# Patient Record
Sex: Male | Born: 1945 | Race: White | Hispanic: No | Smoking: Former smoker
Health system: Southern US, Community
[De-identification: ages and names within clinical notes are randomized; demographics above are authoritative.]

## PROBLEM LIST (undated history)

## (undated) DIAGNOSIS — G4733 Obstructive sleep apnea (adult) (pediatric): Secondary | ICD-10-CM

## (undated) DIAGNOSIS — N200 Calculus of kidney: Secondary | ICD-10-CM

## (undated) DIAGNOSIS — M1990 Unspecified osteoarthritis, unspecified site: Secondary | ICD-10-CM

## (undated) DIAGNOSIS — I10 Essential (primary) hypertension: Secondary | ICD-10-CM

## (undated) DIAGNOSIS — N400 Enlarged prostate without lower urinary tract symptoms: Secondary | ICD-10-CM

## (undated) DIAGNOSIS — E7800 Pure hypercholesterolemia, unspecified: Secondary | ICD-10-CM

## (undated) DIAGNOSIS — K219 Gastro-esophageal reflux disease without esophagitis: Secondary | ICD-10-CM

## (undated) DIAGNOSIS — R972 Elevated prostate specific antigen [PSA]: Secondary | ICD-10-CM

## (undated) HISTORY — DX: Unspecified osteoarthritis, unspecified site: M19.90

## (undated) HISTORY — DX: Gastro-esophageal reflux disease without esophagitis: K21.9

## (undated) HISTORY — DX: Pure hypercholesterolemia, unspecified: E78.00

## (undated) HISTORY — DX: Calculus of kidney: N20.0

## (undated) HISTORY — DX: Benign prostatic hyperplasia without lower urinary tract symptoms: N40.0

## (undated) HISTORY — PX: COLONOSCOPY: SHX174

## (undated) HISTORY — PX: PARS PLANA VITRECTOMY: SHX2166

## (undated) HISTORY — PX: EYE SURGERY: SHX253

## (undated) HISTORY — PX: BACK SURGERY: SHX140

## (undated) HISTORY — PX: OTHER SURGICAL HISTORY: SHX169

## (undated) HISTORY — PX: SUPRAPUBIC PROSTATECTOMY: SUR1056

## (undated) HISTORY — DX: Elevated prostate specific antigen (PSA): R97.20

---

## 2018-08-26 ENCOUNTER — Other Ambulatory Visit (INDEPENDENT_AMBULATORY_CARE_PROVIDER_SITE_OTHER): Payer: Medicare Other

## 2018-08-26 ENCOUNTER — Ambulatory Visit (INDEPENDENT_AMBULATORY_CARE_PROVIDER_SITE_OTHER): Payer: Medicare Other | Admitting: Gastroenterology

## 2018-08-26 ENCOUNTER — Encounter: Payer: Self-pay | Admitting: Gastroenterology

## 2018-08-26 VITALS — BP 122/84 | HR 72 | Ht 70.0 in | Wt 239.0 lb

## 2018-08-26 DIAGNOSIS — R1033 Periumbilical pain: Secondary | ICD-10-CM

## 2018-08-26 DIAGNOSIS — R110 Nausea: Secondary | ICD-10-CM | POA: Diagnosis not present

## 2018-08-26 DIAGNOSIS — Z86010 Personal history of colonic polyps: Secondary | ICD-10-CM

## 2018-08-26 DIAGNOSIS — R1011 Right upper quadrant pain: Secondary | ICD-10-CM

## 2018-08-26 LAB — HEPATIC FUNCTION PANEL
ALT: 23 U/L (ref 0–53)
AST: 19 U/L (ref 0–37)
Albumin: 4.5 g/dL (ref 3.5–5.2)
Alkaline Phosphatase: 70 U/L (ref 39–117)
Bilirubin, Direct: 0.1 mg/dL (ref 0.0–0.3)
Total Bilirubin: 0.7 mg/dL (ref 0.2–1.2)
Total Protein: 7.1 g/dL (ref 6.0–8.3)

## 2018-08-26 LAB — BASIC METABOLIC PANEL
BUN: 19 mg/dL (ref 6–23)
CO2: 27 mEq/L (ref 19–32)
Calcium: 10 mg/dL (ref 8.4–10.5)
Chloride: 103 mEq/L (ref 96–112)
Creatinine, Ser: 1.1 mg/dL (ref 0.40–1.50)
GFR: 65.92 mL/min (ref >60.00)
Glucose, Bld: 107 mg/dL — ABNORMAL HIGH (ref 70–99)
Potassium: 4.1 mEq/L (ref 3.5–5.1)
Sodium: 138 mEq/L (ref 135–145)

## 2018-08-26 LAB — CBC
HCT: 41.5 % (ref 39.0–52.0)
Hemoglobin: 14.5 g/dL (ref 13.0–17.0)
MCHC: 34.9 g/dL (ref 30.0–36.0)
MCV: 90.8 fl (ref 78.0–100.0)
Platelets: 252 10*3/uL (ref 150.0–400.0)
RBC: 4.57 Mil/uL (ref 4.22–5.81)
RDW: 13.4 % (ref 11.5–15.5)
WBC: 7.6 10*3/uL (ref 4.0–10.5)

## 2018-08-26 LAB — LIPASE: Lipase: 41 U/L (ref 11.0–59.0)

## 2018-08-26 LAB — AMYLASE: Amylase: 49 U/L (ref 27–131)

## 2018-08-26 MED ORDER — OMEPRAZOLE 40 MG PO CPDR: 40.0000 mg | DELAYED_RELEASE_CAPSULE | Freq: Two times a day (BID) | ORAL | 2 refills | Status: DC

## 2018-08-26 NOTE — Patient Instructions (Signed)
You have been scheduled for an abdominal ultrasound at [facility] (1st floor of hospital) on 09/03/18 at 10am. Please arrive 15 minutes prior to your appointment for registration. Make certain not to have anything to eat or drink 6 hours prior to your appointment. Should you need to reschedule your appointment, please contact radiology at [phone]. This test typically takes about 30 minutes to perform.  Your provider has requested that you go to the basement level for lab work before leaving today. Press "B" on the elevator. The lab is located at the first door on the left as you exit the elevator.  We have sent the following medications to your pharmacy for you to pick up at your convenience:  We will contact you to schedule your endoscopy procedure for the end of February  Thank you for entrusting me with your care and choosing [facility].  Dr Rush Landmark

## 2018-08-26 NOTE — Progress Notes (Signed)
GASTROENTEROLOGY OUTPATIENT CLINIC VISIT   Primary Care Provider Patient, No Pcp Per No address on file None  Patient Profile: Kyle Nixon is a 73 y.o. male with a pmh significant for BPH, FHx of Cardiac Disease, HLD, HTG.  The patient presents to the [facility] for an evaluation and management of problem(s) noted below:  Problem List 1. History of colonic polyps   2. Umbilical pain   3. Nausea     History of Present Illness: This is the patient's first to the outpatient of our GI clinic.  He is self-referred for further evaluation of issues above.  Approximately 2 weeks ago when he was at a restaurant eating steak he began to develop issues with pain in the periumbilical region that moved to the epigastrium and then to the right lower quadrant and then the right flank.  Within 15 minutes he was at the emergency department and was parking his car when the pain subsided.  Decided to leave and not be evaluated.  He did well thereafter.  However 3 days later he was once again eating a steak and potato based meal and went to bed.  A few hours later he had recurrent abdominal pain occur in the midepigastrium.  Now it is been present for the last 1.5 weeks and has not abated to complete cessation.  It is always there at some small amount of discomfort.  He thinks that eating exacerbates pain at times but not all the time.  There is no alleviating factor other than time.  He normally has 1-2 bowel movements daily.  He does not believe that having a bowel movement improves his pain.  The patient has persistent nausea but has not been vomiting.  He denies overt pyrosis.  There is no dysphagia or odynophagia.  He denies changes in his bowel habits and there is no melena or hematochezia.  His weight has been stable.  He is never had pain like this previously.  Interestingly he thinks that his food is not digesting as well since he seen an increase in how quickly he uses the  restroom after he eats.  The patient takes daily aspirin but no other nonsteroidals.  He drinks social alcohol consumption.  He has not trialed any medications over-the-counter or supplements or teas.  He is never had an upper endoscopy.  He has had a previous colonoscopy performed and has a history of colon polyps.  His last colonoscopy was over 5 years ago.  GI Review of Systems Positive as above Negative for nausea, vomiting, jaundice  Review of Systems General: Denies fevers/chills HEENT: Denies oral lesions Cardiovascular: Denies chest pain Pulmonary: Denies shortness of breath Gastroenterological: See HPI Genitourinary: Denies darkened urine Hematological: Denies easy bruising/bleeding Endocrine: Denies temperature intolerance Dermatological: Denies jaundice Psychological: Mood is stable   Medications Current Outpatient Medications  Medication Sig Dispense Refill  . Ascorbic Acid (VITAMIN C) 100 MG tablet Take 100 mg by mouth daily.    Marland Kitchen aspirin EC 81 MG tablet Take 81 mg by mouth daily.    Marland Kitchen atorvastatin (LIPITOR) 40 MG tablet Take 40 mg by mouth daily.    . CHOLECALCIFEROL PO Take 1,000 Units by mouth daily.    Marland Kitchen gemfibrozil (LOPID) 600 MG tablet Take 600 mg by mouth 2 (two) times daily before a meal.    . omeprazole (PRILOSEC) 20 MG capsule Take 20 mg by mouth daily.    . tamsulosin (FLOMAX) 0.4 MG CAPS capsule Take 0.4 mg by  mouth daily.    Marland Kitchen omeprazole (PRILOSEC) 40 MG capsule Take 1 capsule (40 mg total) by mouth 2 (two) times daily. 60 capsule 2   No current facility-administered medications for this visit.     Allergies Allergies no known allergies  Histories Past Medical History:  Diagnosis Date  . Elevated cholesterol   . Elevated PSA    Past Surgical History:  Procedure Laterality Date  . BACK SURGERY     x3 at [place] History  . Marital status: Widowed    Spouse name: Not on file  . Number of children: 1  . Years of  education: Not on file  . Highest education level: Not on file  Occupational History  . Not on file  Social Needs  . Financial resource strain: Not on file  . Food insecurity:    Worry: Not on file    Inability: Not on file  . Transportation needs:    Medical: Not on file    Non-medical: Not on file  Tobacco Use  . Smoking status: Former Research scientist (life sciences)  . Smokeless tobacco: Never Used  Substance and Sexual Activity  . Alcohol use: Yes    Alcohol/week: 1.0 standard drinks    Types: 1 Cans of beer per week    Comment: every evening  . Drug use: Never  . Sexual activity: Not Currently    Partners: Female  Lifestyle  . Physical activity:    Days per week: Not on file    Minutes per session: Not on file  . Stress: Not on file  Relationships  . Social connections:    Talks on phone: Not on file    Gets together: Not on file    Attends religious service: Not on file    Active member of club or organization: Not on file    Attends meetings of clubs or organizations: Not on file    Relationship status: Not on file  . Intimate partner violence:    Fear of current or ex partner: Not on file    Emotionally abused: Not on file    Physically abused: Not on file    Forced sexual activity: Not on file  Other Topics Concern  . Not on file  Social History Narrative  . Not on file   Family History  Problem Relation Age of Onset  . Heart disease Mother   . Heart disease Father   . Colon cancer Neg Hx   . Pancreatic cancer Neg Hx   . Liver cancer Neg Hx   . Esophageal cancer Neg Hx    I have reviewed his medical, social, and family history in detail and updated the electronic medical record as necessary.    PHYSICAL EXAMINATION  BP 122/84   Pulse 72   Ht 5\' 10"  (1.778 m)   Wt 239 lb (108.4 kg)   BMI 34.29 kg/m  Wt Readings from Last 3 Encounters:  08/26/18 239 lb (108.4 kg)  GEN: NAD, appears stated age, doesn't appear chronically ill PSYCH: Cooperative, without pressured  speech EYE: Conjunctivae pink, sclerae anicteric ENT: MMM, without oral ulcers, no erythema or exudates noted NECK: Supple CV: RR without R/Gs  RESP: CTAB posteriorly, without wheezing GI: NABS, soft, minimal tenderness to palpation in the midepigastrium and periumbilical region, ND, without rebound or guarding, no HSM appreciated MSK/EXT: No lower extremity edema SKIN: No jaundice NEURO:  Alert & Oriented x 3, no focal deficits   REVIEW OF  DATA  I reviewed the following data at the time of this encounter:  GI Procedures and Studies  Previous colonoscopy greater than 5 years ago with reported colon polyps  Laboratory Studies  No labs to review  Imaging Studies  No relevant studies to review   ASSESSMENT  Kyle Nixon is a 73 y.o. male with a pmh significant for BPH, FHx of Cardiac Disease, HLD, HTG.  The patient is seen today for evaluation and management of:  1. History of colonic polyps   2. Umbilical pain   3. Nausea    The patient is hemodynamically stable.  He has had significant change in his normal routine setting of this abdominal pains he has been experiencing.  Waxing and waning component of things and timing of when he has had discomfort is not textbook for biliary colic but I think there is enough here that is concerning that we need to rule out potential gallstone disease.  Dyspepsia/indigestion or gastritis-like discomfort may be possible but the location of where his discomfort is also not clearly textbook either.  Would like to initiate him on twice daily high-dose PPI therapy.  We will consider an upper endoscopy in approximately 6 to 8 weeks he continues to have pain and discomfort.  He has a history of prior colon polyps and will be due for surveillance and thus if a decision of a diagnostic endoscopy is made and then he can have a surveillance colonoscopy performed as well.  The risks and benefits of endoscopic evaluation were discussed with the patient; these  include but are not limited to the risk of perforation, infection, bleeding, missed lesions, lack of diagnosis, severe illness requiring hospitalization, as well as anesthesia and sedation related illnesses.  The patient is agreeable to proceed.  All patient questions were answered, to the best of my ability, and the patient agrees to the aforementioned plan of action with follow-up as indicated.   PLAN  Laboratories as outlined below Abdominal ultrasound as next evaluation for internal organ derangement Begin omeprazole 40 mg twice daily In approximately 6 to 8 weeks if discomforts are persisting we will plan an upper and surveillance lower endoscopy Patient to return for amylase/lipase/hepatic function panel in 6 to 10 hours of severe episode of pain (these orders are in place as future orders)   Orders Placed This Encounter  Procedures  . US Abdomen Complete  . CBC  . Basic metabolic panel  . Amylase  . Lipase  . Hepatic function panel  . Amylase  . Lipase  . Hepatic function panel    New Prescriptions   OMEPRAZOLE (PRILOSEC) 40 MG CAPSULE    Take 1 capsule (40 mg total) by mouth 2 (two) times daily.   Modified Medications   No medications on file    Planned Follow Up: No follow-ups on file.   Justice Britain, MD [facility] Office # [phone]

## 2018-08-28 ENCOUNTER — Encounter: Payer: Self-pay | Admitting: Gastroenterology

## 2018-08-28 DIAGNOSIS — R1011 Right upper quadrant pain: Secondary | ICD-10-CM | POA: Insufficient documentation

## 2018-08-28 DIAGNOSIS — Z860100 Personal history of colon polyps, unspecified: Secondary | ICD-10-CM | POA: Insufficient documentation

## 2018-08-28 DIAGNOSIS — R110 Nausea: Secondary | ICD-10-CM | POA: Insufficient documentation

## 2018-08-28 DIAGNOSIS — R1033 Periumbilical pain: Secondary | ICD-10-CM | POA: Insufficient documentation

## 2018-08-31 ENCOUNTER — Encounter: Payer: PRIVATE HEALTH INSURANCE | Admitting: Gastroenterology

## 2018-09-03 ENCOUNTER — Ambulatory Visit (HOSPITAL_COMMUNITY)
Admission: RE | Admit: 2018-09-03 | Discharge: 2018-09-03 | Disposition: A | Discharge status: Home / Self Care | Payer: Medicare Other | Source: Ambulatory Visit | Attending: Gastroenterology | Admitting: Gastroenterology

## 2018-09-03 DIAGNOSIS — R1033 Periumbilical pain: Secondary | ICD-10-CM | POA: Diagnosis present

## 2018-09-03 DIAGNOSIS — R110 Nausea: Secondary | ICD-10-CM | POA: Insufficient documentation

## 2018-09-03 DIAGNOSIS — Z86010 Personal history of colonic polyps: Secondary | ICD-10-CM | POA: Diagnosis not present

## 2018-09-22 ENCOUNTER — Ambulatory Visit (INDEPENDENT_AMBULATORY_CARE_PROVIDER_SITE_OTHER): Payer: Medicare Other | Admitting: Gastroenterology

## 2018-09-22 ENCOUNTER — Encounter: Payer: Self-pay | Admitting: Gastroenterology

## 2018-09-22 VITALS — BP 120/70 | HR 65 | Ht 70.0 in | Wt 243.2 lb

## 2018-09-22 DIAGNOSIS — R1033 Periumbilical pain: Secondary | ICD-10-CM | POA: Diagnosis not present

## 2018-09-22 DIAGNOSIS — R1011 Right upper quadrant pain: Secondary | ICD-10-CM

## 2018-09-22 DIAGNOSIS — Z86010 Personal history of colonic polyps: Secondary | ICD-10-CM | POA: Diagnosis not present

## 2018-09-22 NOTE — Progress Notes (Signed)
GASTROENTEROLOGY OUTPATIENT CLINIC VISIT   Primary Care Provider Patient, No Pcp Per No address on file None  Patient Profile: Kyle Nixon is a 73 y.o. male with a pmh significant for BPH, FHx of Cardiac Disease, HLD, HTG, Hx Colon Polyps (unknown pathology >5 years ago).  The patient presents to the [facility] for an evaluation and management of problem(s) noted below:  Problem List 1. History of colonic polyps   2. Umbilical pain   3. RUQ pain     History of Present Illness: Please see initial consultation note for full details of HPI.  Interval History: Patient has done exceedingly well.  He no longer has had any pain.  He is not clear if it is a result of the medications or just with time.  He feels well.  He is not sure he wants to have an upper endoscopy right now but does want to get his colonoscopy completed.  He was recently being treated for an upper respiratory/sinus infection.  GI Review of Systems Positive as above Negative for pyrosis, dysphagia, odynophagia, abdominal pain, nausea, vomiting, melena, hematochezia, change in bowel habits  Review of Systems General: Denies fevers/chills Cardiovascular: Denies chest pain Pulmonary: Denies shortness of breath Gastroenterological: See HPI Genitourinary: Denies darkened urine Hematological: Denies easy bruising Dermatological: Denies jaundice Psychological: Mood is stable   Medications Current Outpatient Medications  Medication Sig Dispense Refill  . Ascorbic Acid (VITAMIN C) 100 MG tablet Take 100 mg by mouth daily.    Marland Kitchen aspirin EC 81 MG tablet Take 81 mg by mouth daily.    Marland Kitchen atorvastatin (LIPITOR) 40 MG tablet Take 40 mg by mouth daily.    . CHOLECALCIFEROL PO Take 1,000 Units by mouth daily.    Marland Kitchen gemfibrozil (LOPID) 600 MG tablet Take 600 mg by mouth 2 (two) times daily before a meal.    . predniSONE (DELTASONE) 10 MG tablet Take 10 mg by mouth taper from 4 doses each day to 1 dose  and stop.    . tamsulosin (FLOMAX) 0.4 MG CAPS capsule Take 0.4 mg by mouth daily.     No current facility-administered medications for this visit.     Allergies No Known Allergies  Histories Past Medical History:  Diagnosis Date  . Elevated cholesterol   . Elevated PSA    Past Surgical History:  Procedure Laterality Date  . BACK SURGERY     x3 at [place] History  . Marital status: Widowed    Spouse name: Not on file  . Number of children: 1  . Years of education: Not on file  . Highest education level: Not on file  Occupational History  . Not on file  Social Needs  . Financial resource strain: Not on file  . Food insecurity:    Worry: Not on file    Inability: Not on file  . Transportation needs:    Medical: Not on file    Non-medical: Not on file  Tobacco Use  . Smoking status: Former Research scientist (life sciences)  . Smokeless tobacco: Never Used  Substance and Sexual Activity  . Alcohol use: Yes    Alcohol/week: 1.0 standard drinks    Types: 1 Cans of beer per week    Comment: every evening  . Drug use: Never  . Sexual activity: Not Currently    Partners: Female  Lifestyle  . Physical activity:    Days per week: Not on file    Minutes per  session: Not on file  . Stress: Not on file  Relationships  . Social connections:    Talks on phone: Not on file    Gets together: Not on file    Attends religious service: Not on file    Active member of club or organization: Not on file    Attends meetings of clubs or organizations: Not on file    Relationship status: Not on file  . Intimate partner violence:    Fear of current or ex partner: Not on file    Emotionally abused: Not on file    Physically abused: Not on file    Forced sexual activity: Not on file  Other Topics Concern  . Not on file  Social History Narrative  . Not on file   Family History  Problem Relation Age of Onset  . Heart disease Mother   . Heart disease Father   . Colon  cancer Neg Hx   . Pancreatic cancer Neg Hx   . Liver cancer Neg Hx   . Esophageal cancer Neg Hx    I have reviewed his medical, social, and family history in detail and updated the electronic medical record as necessary.    PHYSICAL EXAMINATION  BP 120/70   Pulse 65   Ht 5\' 10"  (1.778 m)   Wt 243 lb 3.2 oz (110.3 kg)   SpO2 95%   BMI 34.90 kg/m  Wt Readings from Last 3 Encounters:  09/22/18 243 lb 3.2 oz (110.3 kg)  08/26/18 239 lb (108.4 kg)  GEN: NAD, appears stated age, doesn't appear chronically ill PSYCH: Cooperative, without pressured speech EYE: Conjunctivae pink, sclerae anicteric ENT: MMM CV: RR without R/Gs  RESP: CTAB posteriorly, without wheezing GI: NABS, soft, nontender, nondistended, rounded, no hepatosplenomegaly appreciated, without rebound or guarding MSK/EXT: No lower extremity edema SKIN: No jaundice NEURO:  Alert & Oriented x 3, no focal deficits   REVIEW OF DATA  I reviewed the following data at the time of this encounter:  GI Procedures and Studies  Previous colonoscopy greater than 5 years ago with reported colon polyps We were not able to obtain the results  Laboratory Studies  Reviewed in epic  Imaging Studies  January 2020 ultrasound abdomen IMPRESSION: No acute or focal abnormality identified.   ASSESSMENT  Mr. Kandler is a 73 y.o. male with a pmh significant for BPH, FHx of Cardiac Disease, HLD, HTG, Hx Colon Polyps (unknown pathology >5 years ago).  The patient is seen today for evaluation and management of:  1. History of colonic polyps   2. Umbilical pain   3. RUQ pain    The patient is doing exceedingly well at this point in time.  No evidence on imaging to suggest cholecystitis or biliary colic/cholelithiasis playing a role.  As his upper GI symptoms have abated we will plan to start titrating down his omeprazole.  He will complete through the month of February twice daily PPI and then start transitioning to 40 mg once daily  for 2 months and then down to 20 mg over-the-counter for 2 months and then hopefully off.  If in the period of down titrating his PPI he has recurrence of his symptoms then we will consider up titrating and an upper endoscopy for diagnostic purposes.  The patient is due for colon cancer screening/colon polyp surveillance as he has a history of prior polyps greater than 5 years ago and was told to have a 5-year follow-up (we were never able to obtain  the outside records).  We will proceed with a colonoscopy for surveillance purposes.  The risks and benefits of endoscopic evaluation were discussed with the patient; these include but are not limited to the risk of perforation, infection, bleeding, missed lesions, lack of diagnosis, severe illness requiring hospitalization, as well as anesthesia and sedation related illnesses.  The patient is agreeable to proceed.  All patient questions were answered, to the best of my ability, and the patient agrees to the aforementioned plan of action with follow-up as indicated.   PLAN  Proceed with scheduling a colonoscopy for surveillance PPI 40 mg twice daily for completion of February then start 40 mg daily March/April then 20 mg daily May/June then off If evidence of recurrent abdominal pain with down titration will then re-uptitrate and consider a diagnostic endoscopy to be added Patient to return for amylase/lipase/hepatic function panel in 6 to 10 hours of severe episode of pain (these orders remain in place as future orders)   No orders of the defined types were placed in this encounter.   New Prescriptions   No medications on file   Modified Medications   No medications on file    Planned Follow Up: No follow-ups on file.   Justice Britain, MD [facility] Office # [phone]

## 2018-09-22 NOTE — Patient Instructions (Signed)
If you are age 72 or older, your body mass index should be between 23-30. Your Body mass index is 34.9 kg/m. If this is out of the aforementioned range listed, please consider follow up with your Primary Care Provider.  If you are age 52 or younger, your body mass index should be between 19-25. Your Body mass index is 34.9 kg/m. If this is out of the aformentioned range listed, please consider follow up with your Primary Care Provider.     Continue PPI- Omeprazole 40 mg twice daily until the end of the month.  Starting in March - April take omeprazole 40mg  once daily. You can then start over the counter omeprazole once daily thereafter.   It has been recommended to you by your physician that you have a(n) Colon/ LEC/ in April 2020 completed. Per your request, we did not schedule the procedure(s) today. Please contact our office at [phone] should you decide to have the procedure completed.  Thank you for choosing me and [facility].  Dr. Rush Landmark

## 2018-09-23 ENCOUNTER — Encounter: Payer: Self-pay | Admitting: Gastroenterology

## 2019-11-26 IMAGING — US US ABDOMEN COMPLETE
1 series · 14 of 25 positions shown · non-contrast
Comparison: No recent prior.

CLINICAL DATA: Nausea.  Shoulder pain.

EXAM:
ABDOMEN ULTRASOUND COMPLETE

[Series 1: us abdomen complete · 14 of 78 slices shown]
[im 1/78]
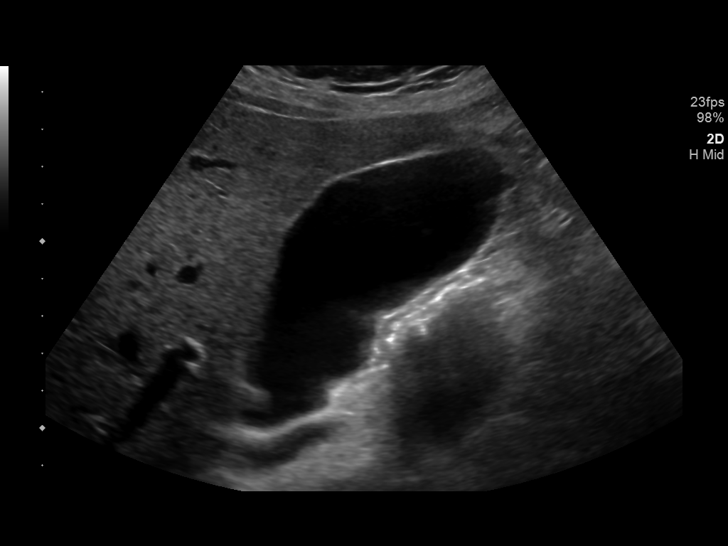
[im 7/78]
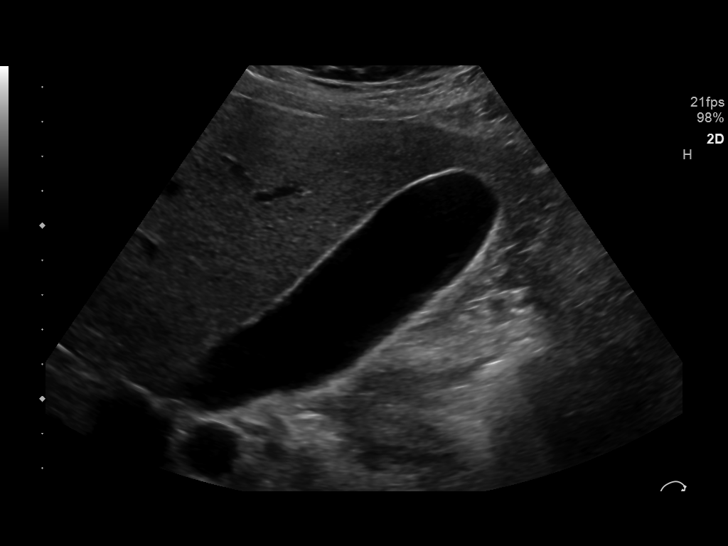
[im 13/78]
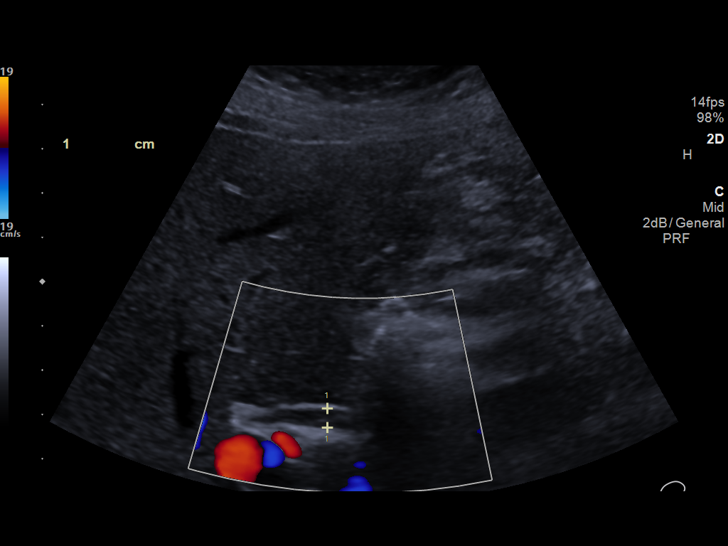
[im 20/78]
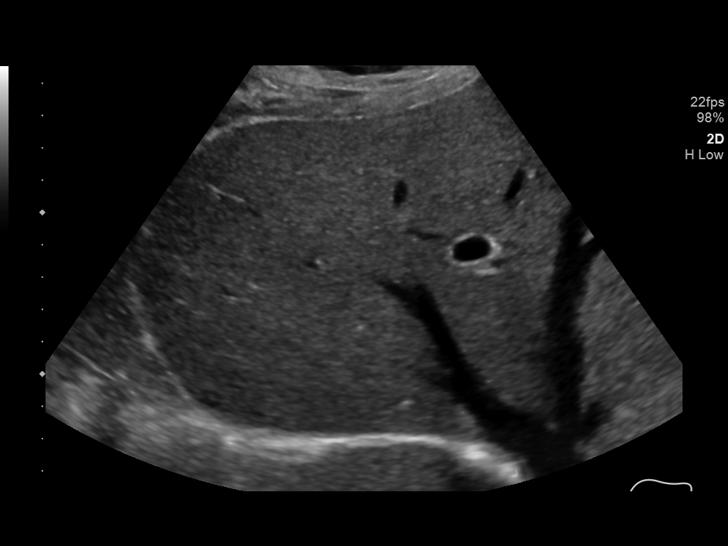
[im 26/78]
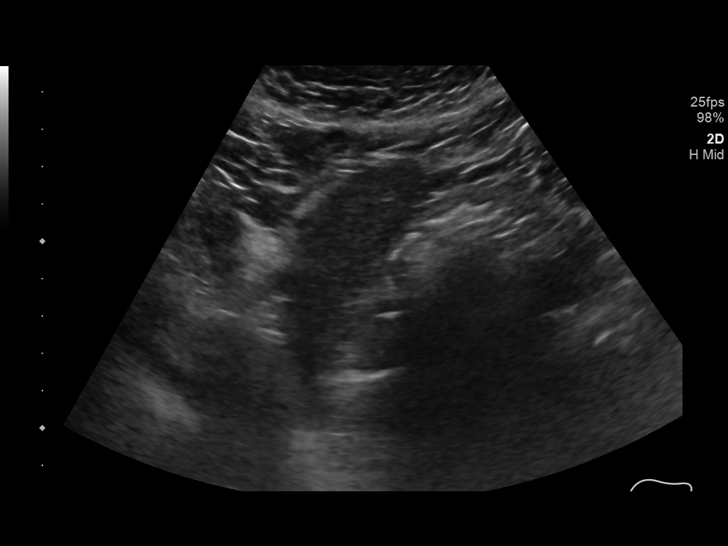
[im 29/78]
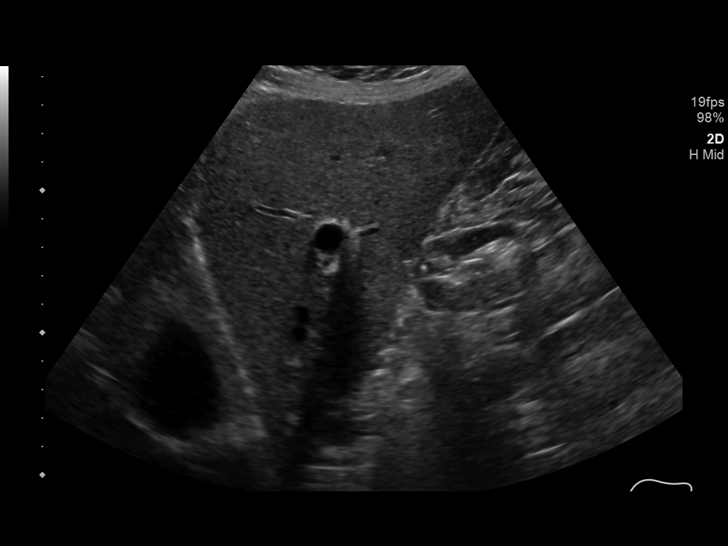
[im 36/78]
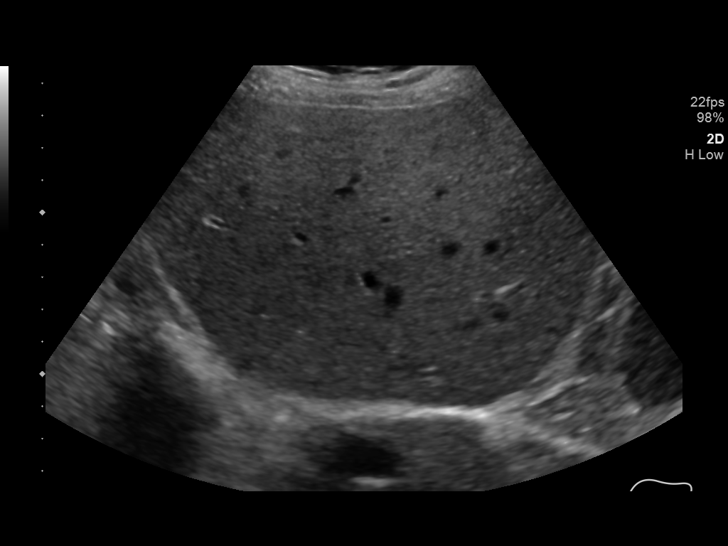
[im 42/78]
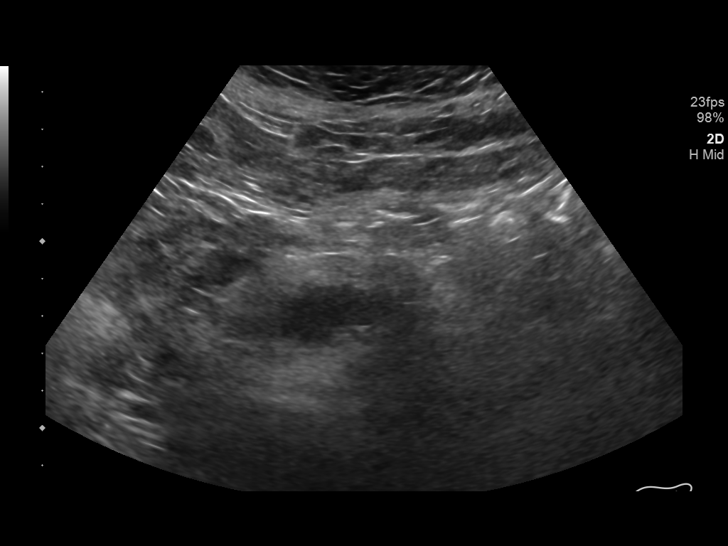
[im 49/78]
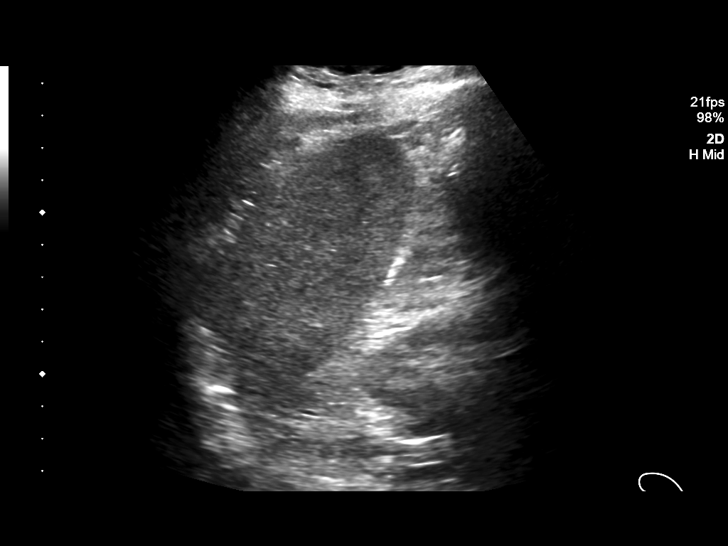
[im 52/78]
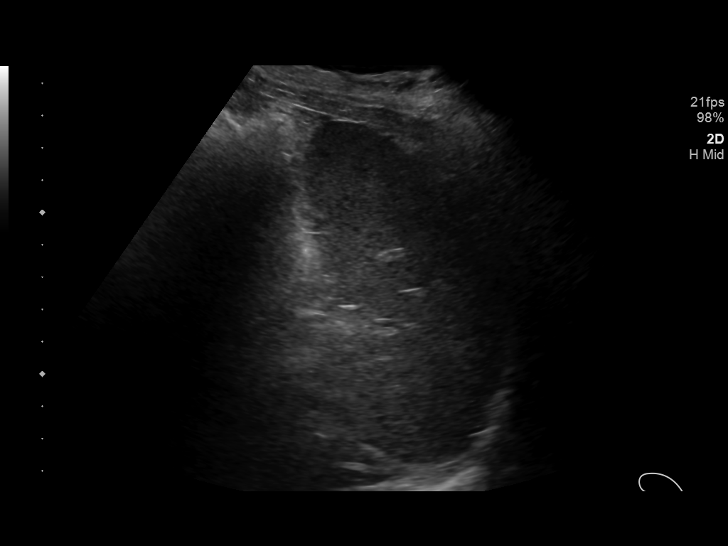
[im 58/78]
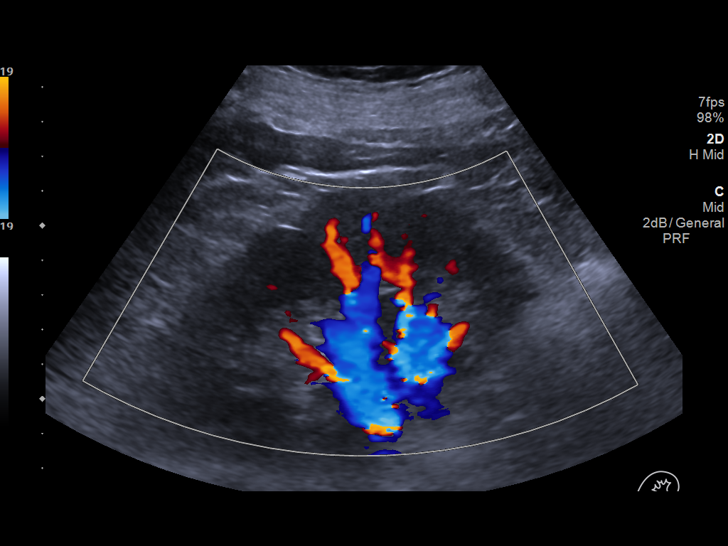
[im 65/78]
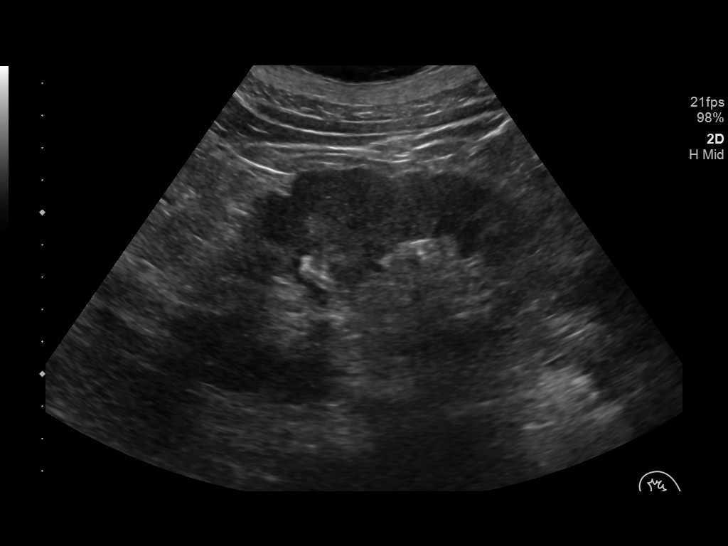
[im 71/78]
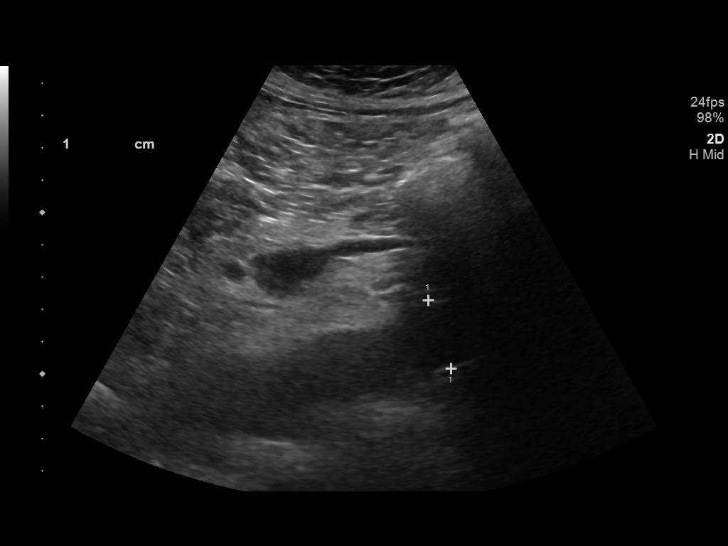
[im 78/78]
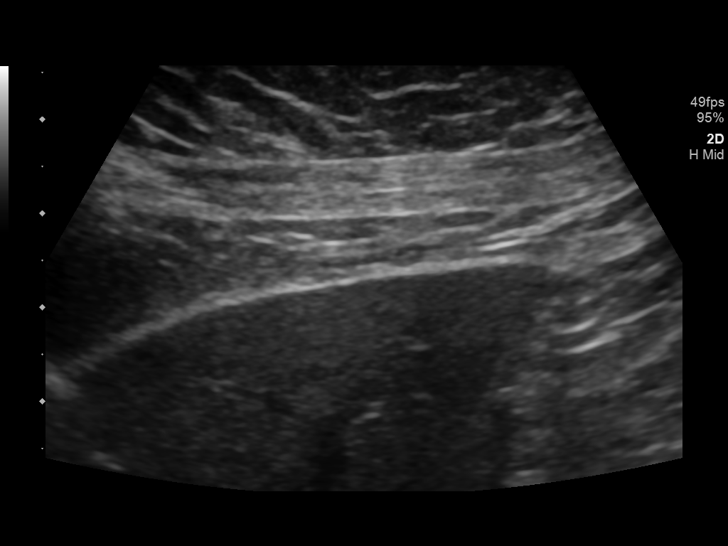

[14 of 25 positions shown; findings below may reference images not displayed]

FINDINGS: Gallbladder: No gallstones or wall thickening visualized. No
sonographic Murphy sign noted by sonographer.

Common bile duct: Diameter: 4.3 mm

Liver: No focal lesion identified. Within normal limits in
parenchymal echogenicity. Portal vein is patent on color Doppler
imaging with normal direction of blood flow towards the liver.

IVC: No abnormality visualized.

Pancreas: Visualized portion unremarkable.

Spleen: Size and appearance within normal limits.

Right Kidney: Length: 12.9 cm. Echogenicity within normal limits. No
mass or hydronephrosis visualized.

Left Kidney: Length: 12.4 cm. Echogenicity within normal limits. No
mass or hydronephrosis visualized.

Abdominal aorta: No aneurysm visualized.

Other findings: None.
IMPRESSION: No acute or focal abnormality identified.

## 2020-05-17 ENCOUNTER — Telehealth: Payer: Self-pay | Admitting: Gastroenterology

## 2020-05-17 NOTE — Telephone Encounter (Signed)
Pt called to schedule endo colon as he stated that he had to postponed it because of Covid. Per Dr Rush Landmark ov notes, pt needs colon but not sure about endo. Pt states that he is still having issues with his stomach. Please advise if he needs to come for ov again or if he can be direct booked for both procedures.

## 2020-05-18 NOTE — Telephone Encounter (Signed)
OK to proceed with both EGD/Colonoscopy.  My last notation was to consider EGD if still or recurrent issues, so I am OK for double. Thanks. GM

## 2020-05-18 NOTE — Telephone Encounter (Signed)
Left message on vm for patient to return call to get scheduled for EGD/colon. Thank You

## 2020-05-22 ENCOUNTER — Encounter: Payer: Self-pay | Admitting: Gastroenterology

## 2020-06-25 NOTE — Telephone Encounter (Signed)
Called patient to offer EGD at time of Colonoscopy.  Patient changed his mind and does not want to have EGD at this time.

## 2020-06-25 NOTE — Telephone Encounter (Signed)
Noted  

## 2020-07-03 ENCOUNTER — Ambulatory Visit (AMBULATORY_SURGERY_CENTER): Payer: Self-pay | Admitting: *Deleted

## 2020-07-03 ENCOUNTER — Other Ambulatory Visit: Payer: Self-pay

## 2020-07-03 VITALS — Ht 68.5 in | Wt 247.2 lb

## 2020-07-03 DIAGNOSIS — Z86010 Personal history of colonic polyps: Secondary | ICD-10-CM

## 2020-07-03 NOTE — Progress Notes (Signed)
Patient denies any allergies to egg or soy products. Patient denies complications with anesthesia/sedation.  Patient denies oxygen use at home and denies diet medications. Emmi instructions for colonoscopy explainedbut patient denied information.  Patient is fully vaccinated by [facility].  Booster on 06/24/20.

## 2020-07-18 ENCOUNTER — Other Ambulatory Visit: Payer: Self-pay

## 2020-07-18 ENCOUNTER — Ambulatory Visit (AMBULATORY_SURGERY_CENTER): Payer: Medicare Other | Admitting: Gastroenterology

## 2020-07-18 ENCOUNTER — Encounter: Payer: Self-pay | Admitting: Gastroenterology

## 2020-07-18 VITALS — BP 132/90 | HR 52 | Temp 98.1°F | Resp 15 | Ht 68.5 in | Wt 247.2 lb

## 2020-07-18 DIAGNOSIS — Z86010 Personal history of colonic polyps: Secondary | ICD-10-CM | POA: Diagnosis present

## 2020-07-18 DIAGNOSIS — D120 Benign neoplasm of cecum: Secondary | ICD-10-CM

## 2020-07-18 DIAGNOSIS — D127 Benign neoplasm of rectosigmoid junction: Secondary | ICD-10-CM

## 2020-07-18 DIAGNOSIS — D123 Benign neoplasm of transverse colon: Secondary | ICD-10-CM

## 2020-07-18 DIAGNOSIS — D122 Benign neoplasm of ascending colon: Secondary | ICD-10-CM

## 2020-07-18 MED ORDER — FLEET ENEMA 7-19 GM/118ML RE ENEM
1.0000 | ENEMA | Freq: Once | RECTAL | Status: AC
  Administered 2020-07-18: 1 via RECTAL

## 2020-07-18 MED ORDER — SODIUM CHLORIDE 0.9 % IV SOLN: 500.0000 mL | Freq: Once | INTRAVENOUS | Status: DC

## 2020-07-18 NOTE — Progress Notes (Signed)
A/ox3, pleased with MAC, report to RN 

## 2020-07-18 NOTE — Progress Notes (Signed)
Called to room to assist during endoscopic procedure.  Patient ID and intended procedure confirmed with present staff. Received instructions for my participation in the procedure from the performing physician.  

## 2020-07-18 NOTE — Op Note (Addendum)
[place] Patient Name: Kyle Nixon Procedure Date: 07/18/2020 3:19 PM MRN: [id number] Endoscopist: Justice Britain , MD Age: 74 Referring MD:  Date of Birth: 01-14-1946 Gender: Male Account #: [id number] Procedure:                Colonoscopy Indications:              Screening for colorectal malignant neoplasm Medicines:                Monitored Anesthesia Care Procedure:                Pre-Anesthesia Assessment:                           - Prior to the procedure, a History and Physical                            was performed, and patient medications and                            allergies were reviewed. The patient's tolerance of                            previous anesthesia was also reviewed. The risks                            and benefits of the procedure and the sedation                            options and risks were discussed with the patient.                            All questions were answered, and informed consent                            was obtained. Prior Anticoagulants: The patient has                            taken no previous anticoagulant or antiplatelet                            agents. ASA Grade Assessment: II - A patient with                            mild systemic disease. After reviewing the risks                            and benefits, the patient was deemed in                            satisfactory condition to undergo the procedure.                           After obtaining informed consent, the colonoscope  was passed under direct vision. Throughout the                            procedure, the patient's blood pressure, pulse, and                            oxygen saturations were monitored continuously. The                            Colonoscope was introduced through the anus and                            advanced to the 5 cm into the ileum. The                            colonoscopy was  performed without difficulty. The                            patient tolerated the procedure. The quality of the                            bowel preparation was adequate. The terminal ileum,                            ileocecal valve, appendiceal orifice, and rectum                            were photographed. Scope In: 3:37:01 PM Scope Out: 4:00:17 PM Scope Withdrawal Time: 0 hours 20 minutes 9 seconds  Total Procedure Duration: 0 hours 23 minutes 16 seconds  Findings:                 The digital rectal exam findings include                            hemorrhoids. Pertinent negatives include no                            palpable rectal lesions.                           The terminal ileum and ileocecal valve appeared                            normal.                           12 sessile and semi-sessile polyps were found in                            the recto-sigmoid colon (2), transverse colon (6),                            ascending colon (2), and cecum (2). The polyps were  2 to 14 mm in size. These polyps were removed with                            a cold snare. Resection and retrieval were complete.                           Normal mucosa was found in the entire colon                            otherwise.                           Non-bleeding non-thrombosed external and internal                            hemorrhoids were found during retroflexion, during                            perianal exam and during digital exam. The                            hemorrhoids were Grade II (internal hemorrhoids                            that prolapse but reduce spontaneously). Complications:            No immediate complications. Estimated Blood Loss:     Estimated blood loss was minimal. Impression:               - Hemorrhoids found on digital rectal exam.                           - The examined portion of the ileum was normal.                           -  12, 2 to 14 mm polyps at the recto-sigmoid colon,                            in the transverse colon, in the ascending colon and                            in the cecum, removed with a cold snare. Resected                            and retrieved.                           - Normal mucosa in the entire examined colon                            otherwise.                           - Non-bleeding non-thrombosed external and internal  hemorrhoids. Recommendation:           - The patient will be observed post-procedure,                            until all discharge criteria are met.                           - Discharge patient to home.                           - Patient has a contact number available for                            emergencies. The signs and symptoms of potential                            delayed complications were discussed with the                            patient. Return to normal activities tomorrow.                            Written discharge instructions were provided to the                            patient.                           - High fiber diet.                           - Use FiberCon 1-2 tablets PO daily.                           - Continue present medications.                           - Await pathology results.                           - Repeat colonoscopy in likely 1 year for                            surveillance based on pathology results.                           - The findings and recommendations were discussed                            with the patient.                           - The findings and recommendations were discussed                            with the patient's family. Justice Britain, MD 07/18/2020 4:07:40  PM

## 2020-07-18 NOTE — Progress Notes (Signed)
VS-CW  Pt's states no medical or surgical changes since previsit or office visit.   Pt reports last BM was dark brown liquid. Dr. Rush Landmark made aware. Orders received for fleets enema. Results were clear, dark yellow. No solid pieces. MD made aware.

## 2020-07-18 NOTE — Patient Instructions (Addendum)
Handouts were given to you on polyps, hemorrhoids, and a high fiber diet with liberal fluid intake. Add ober the counter fiber supplement FIBERCON take 1-2 tabs daily by mouth.  Make sure you have a liberal amount of fluids. You may resume your current medications today. Await biopsy results. It usually takes 1-3 weeks to receive the pathology results. Most likely, repeat colonoscopy in 1 year for repeat colonoscopy. Please call if any questions or concerns.    YOU HAD AN ENDOSCOPIC PROCEDURE TODAY AT [facility]:   Refer to the procedure report that was given to you for any specific questions about what was found during the examination.  If the procedure report does not answer your questions, please call your gastroenterologist to clarify.  If you requested that your care partner not be given the details of your procedure findings, then the procedure report has been included in a sealed envelope for you to review at your convenience later.  YOU SHOULD EXPECT: Some feelings of bloating in the abdomen. Passage of more gas than usual.  Walking can help get rid of the air that was put into your GI tract during the procedure and reduce the bloating. If you had a lower endoscopy (such as a colonoscopy or flexible sigmoidoscopy) you may notice spotting of blood in your stool or on the toilet paper. If you underwent a bowel prep for your procedure, you may not have a normal bowel movement for a few days.  Please Note:  You might notice some irritation and congestion in your nose or some drainage.  This is from the oxygen used during your procedure.  There is no need for concern and it should clear up in a day or so.  SYMPTOMS TO REPORT IMMEDIATELY:   Following lower endoscopy (colonoscopy or flexible sigmoidoscopy):  Excessive amounts of blood in the stool  Significant tenderness or worsening of abdominal pains  Swelling of the abdomen that is new, acute  Fever of 100F or  higher   For urgent or emergent issues, a gastroenterologist can be reached at any hour by calling [phone]. Do not use MyChart messaging for urgent concerns.    DIET:  We do recommend a small meal at first, but then you may proceed to your regular diet.  Drink plenty of fluids but you should avoid alcoholic beverages for 24 hours.  ACTIVITY:  You should plan to take it easy for the rest of today and you should NOT DRIVE or use heavy machinery until tomorrow (because of the sedation medicines used during the test).    FOLLOW UP: Our staff will call the number listed on your records 48-72 hours following your procedure to check on you and address any questions or concerns that you may have regarding the information given to you following your procedure. If we do not reach you, we will leave a message.  We will attempt to reach you two times.  During this call, we will ask if you have developed any symptoms of COVID 19. If you develop any symptoms (ie: fever, flu-like symptoms, shortness of breath, cough etc.) before then, please call [phone].  If you test positive for Covid 19 in the 2 weeks post procedure, please call and report this information to [place].    If any biopsies were taken you will be contacted by phone or by letter within the next 1-3 weeks.  Please call us at [phone] if you have not heard about the biopsies in  3 weeks.    SIGNATURES/CONFIDENTIALITY: You and/or your care partner have signed paperwork which will be entered into your electronic medical record.  These signatures attest to the fact that that the information above on your After Visit Summary has been reviewed and is understood.  Full responsibility of the confidentiality of this discharge information lies with you and/or your care-partner.

## 2020-07-18 NOTE — Progress Notes (Signed)
No problems noted in the recovery room. maw 

## 2020-07-20 ENCOUNTER — Telehealth: Payer: Self-pay | Admitting: *Deleted

## 2020-07-20 NOTE — Telephone Encounter (Signed)
  Follow up Call-  Call back number 07/18/2020  Post procedure Call Back phone  # [phone]  Permission to leave phone message Yes     Patient questions:  Do you have a fever, pain , or abdominal swelling? No. Pain Score  0 *  Have you tolerated food without any problems? Yes.    Have you been able to return to your normal activities? Yes.    Do you have any questions about your discharge instructions: Diet   Yes.   Medications  Yes.   Follow up visit  Yes.    Do you have questions or concerns about your Care? No.  Actions: * If pain score is 4 or above: No action needed, pain <4.  1. Have you developed a fever since your procedure? no  2.   Have you had an respiratory symptoms (SOB or cough) since your procedure? no  3.   Have you tested positive for COVID 19 since your procedure no  4.   Have you had any family members/close contacts diagnosed with the COVID 19 since your procedure?  no   If yes to any of these questions please route to Joylene John, RN and Joella Prince, RN

## 2020-07-25 ENCOUNTER — Encounter: Payer: Self-pay | Admitting: Gastroenterology

## 2020-12-28 ENCOUNTER — Ambulatory Visit: Payer: Medicare Other | Admitting: Gastroenterology

## 2021-07-02 ENCOUNTER — Encounter: Payer: Self-pay | Admitting: Gastroenterology

## 2021-08-15 ENCOUNTER — Ambulatory Visit: Payer: Medicare Other | Admitting: Gastroenterology

## 2021-08-30 ENCOUNTER — Ambulatory Visit: Payer: Medicare Other | Admitting: Nurse Practitioner

## 2021-09-03 ENCOUNTER — Encounter: Payer: Medicare Other | Admitting: Gastroenterology

## 2021-09-12 ENCOUNTER — Encounter: Payer: Self-pay | Admitting: Gastroenterology

## 2021-09-13 ENCOUNTER — Ambulatory Visit (INDEPENDENT_AMBULATORY_CARE_PROVIDER_SITE_OTHER): Payer: Medicare Other | Admitting: Nurse Practitioner

## 2021-09-13 ENCOUNTER — Encounter: Payer: Self-pay | Admitting: Nurse Practitioner

## 2021-09-13 VITALS — BP 140/80 | HR 87 | Ht 68.5 in | Wt 252.8 lb

## 2021-09-13 DIAGNOSIS — K219 Gastro-esophageal reflux disease without esophagitis: Secondary | ICD-10-CM

## 2021-09-13 DIAGNOSIS — R110 Nausea: Secondary | ICD-10-CM | POA: Diagnosis not present

## 2021-09-13 DIAGNOSIS — R1010 Upper abdominal pain, unspecified: Secondary | ICD-10-CM

## 2021-09-13 DIAGNOSIS — Z860100 Personal history of colon polyps, unspecified: Secondary | ICD-10-CM

## 2021-09-13 NOTE — Progress Notes (Signed)
Attending Physician's Attestation   I have reviewed the chart.   I agree with the Advanced Practitioner's note, impression, and recommendations with any updates as below.    Jolea Dolle Mansouraty, MD [facility] Advanced [facility]oscopy Office # [phone]  

## 2021-09-13 NOTE — Patient Instructions (Addendum)
For now, increase Omeprazole to twice daily. Take 30 minutes prior to breakfast and 30 minutes prior to dinner  Please stop Ibuprofen until time of EGD   Acid Reflux --If you are taking anti-reflux medication be sure to take them 30 minutes before meal(s) --To help with acid reflux symptoms you should avoid evening meals / bedtime snacks. If able, elevate head of bed 6-8 inches. If unable to elevate the head of the bed consider purchasing a wedge pillow to sleep on at night.    --Weight reduction / maintain a healthy BMI ( body mass index) may be help with reflux symptoms  --Avoid trigger foods ( foods which you know tend to aggravate you reflux symptoms). Some of the more common triggers include spicy foods, fatty foods, acidic foods, and chocolate --Avoid caffeine  If you are age 75 or older, your body mass index should be between 23-30. Your Body mass index is 37.88 kg/m. If this is out of the aforementioned range listed, please consider follow up with your Primary Care Provider.  If you are age 39 or younger, your body mass index should be between 19-25. Your Body mass index is 37.88 kg/m. If this is out of the aformentioned range listed, please consider follow up with your Primary Care Provider.   ________________________________________________________  The [facility] providers would like to encourage you to use [facility] to communicate with providers for non-urgent requests or questions.  Due to long hold times on the telephone, sending your provider a message by [facility] may be a faster and more efficient way to get a response.  Please allow 48 business hours for a response.  Please remember that this is for non-urgent requests.  _______________________________________________________ I appreciate the  opportunity to care for you  Thank You   [place]

## 2021-09-13 NOTE — Progress Notes (Signed)
ASSESSMENT AND PLAN    # 76 yo male with 3-4 month history of generalized upper abdominal discomfort and more recent development of post-prandial nausea and frequent belching. Rule out PUD in setting of NSAIDs. Of note, the nausea developed last month while on prolonged course of antibiotics.  --Just completed antibiotics 2 days ago so maybe nausea will improve  -- No anti-inflammatory medication for now ( was taking Ibuprofen).  --For now, increase Omeprazole to twice daily. Take 30 minutes prior to breakfast and 30 minutes prior to dinner --Will schedule for EGD. The risks and benefits of EGD with possible biopsies were discussed with the patient who agrees to proceed.  --If EGD negative and symptoms persist then consider gallbladder US or maybe CT scan since his gallbladder appeared okay on [place] in Jan 2020  # Chronic GERD. Sounds like he had an EGD > 30 years ago. Having breakthrough symptoms on daily Omeprazole --Further evaluation of breakthrough symptoms at time of EGD --Discussed anti-reflux measures such as avoidance of evening meals / bedtime snacks, weight reduction  /maintaining a healthy BMI ( body mass index),  and avoidance of trigger foods and caffeine   # History of multiple adenomatous colon polyps. Due for surveillance colonoscopy.  --Schedule for a colonoscopy. The risks and benefits of colonoscopy with possible polypectomy / biopsies were discussed and the patient agrees to proceed.    HISTORY OF PRESENT ILLNESS    Chief Complaint : belching, abdominal discomfort, nausea.   Kyle Nixon is a 76 y.o. male with a past medical history of obesity, GERD, adenomatous colon polyps, arthritis, HLD, BPH s/p prostatectomy.  Additional medical history as listed in [place] .   Patient is known to Dr.  Rush Landmark, seen last at time of colonoscopy in December 2021.   Kyle Nixon is due for polyp surveillance colonoscopy but came into discuss some upper GI symptoms that he has  developed. He has had several months of excessive belching.  He took several couple of courses of antibiotics for respiratory infection in January but belching preceded that. While on antibiotics he developed post-prandial nausea and generalized upper abdominal pain. The pain is really more of a discomfort, it doesn't radiate anywhere. He hasn't had any weight loss associated with symptoms. No vomiting.  He just finished antibiotics two days ago.  He has been taking Ibuprofen 1-2 times a day for several months.   Kyle Nixon has chronic GERD for which he takes daily Omeprazole. He frequently gets breakthrough daytime " indigestion" and acid regurgitation but not really any heartburn.     LABORATORY DATA 11/30/20 CBC normal, AST / ALT normal.    PREVIOUS ENDOSCOPIC EVALUATIONS    December 2021  --hemorrhoids --twelve 2-14 mm polyps  Past Medical History:  Diagnosis Date   Arthritis    Hands   BPH (benign prostatic hyperplasia)    Elevated cholesterol    Elevated PSA    GERD (gastroesophageal reflux disease)    Kidney stone    surgery to removed    Past Surgical History:  Procedure Laterality Date   BACK SURGERY     x3 at [facility]   COLONOSCOPY     hx polyps   EYE SURGERY     x 6, 3 on right , 3 on left   PARS PLANA VITRECTOMY     x 2   removal of kidney stone     SUPRAPUBIC PROSTATECTOMY      Current Medications, Allergies, Family History and Social History  were reviewed in [place] record.     Current Outpatient Medications  Medication Sig Dispense Refill   atorvastatin (LIPITOR) 40 MG tablet Take 40 mg by mouth daily.     gemfibrozil (LOPID) 600 MG tablet Take 600 mg by mouth 2 (two) times daily before a meal.     OMEPRAZOLE PO Take 20 mg by mouth daily.      No current facility-administered medications for this visit.    Review of Systems: Positive for fatigue, allergy / sinus problems. No shortness of breath. PHYSICAL EXAM :    Wt Readings  from Last 3 Encounters:  09/13/21 252 lb 12.8 oz (114.7 kg)  07/18/20 247 lb 3.2 oz (112.1 kg)  07/03/20 247 lb 3.2 oz (112.1 kg)    BP 140/80    Pulse 87    Ht 5' 8.5" (1.74 m)    Wt 252 lb 12.8 oz (114.7 kg)    BMI 37.88 kg/m  Constitutional:  Generally well appearing male in no acute distress. Psychiatric: Pleasant. Normal mood and affect. Behavior is normal. EENT: Pupils normal.  Conjunctivae are normal. No scleral icterus. Neck supple.  Cardiovascular: Normal rate, regular rhythm. No edema Pulmonary/chest: Effort normal and breath sounds normal. No wheezing, rales or rhonchi. Abdominal: Soft, nondistended, nontender. Bowel sounds active throughout. There are no masses palpable. No hepatomegaly. Neurological: Alert and oriented to person place and time. Skin: Skin is warm and dry. No rashes noted.  Kyle Savoy, NP  09/13/2021, 3:02 PM

## 2021-10-09 ENCOUNTER — Encounter: Payer: Self-pay | Admitting: Gastroenterology

## 2021-10-09 ENCOUNTER — Ambulatory Visit (AMBULATORY_SURGERY_CENTER): Payer: Medicare Other | Admitting: Gastroenterology

## 2021-10-09 VITALS — BP 131/79 | HR 60 | Temp 98.9°F | Resp 14 | Ht 68.0 in | Wt 252.0 lb

## 2021-10-09 DIAGNOSIS — D122 Benign neoplasm of ascending colon: Secondary | ICD-10-CM | POA: Diagnosis not present

## 2021-10-09 DIAGNOSIS — K2970 Gastritis, unspecified, without bleeding: Secondary | ICD-10-CM | POA: Diagnosis not present

## 2021-10-09 DIAGNOSIS — Z860100 Personal history of colon polyps, unspecified: Secondary | ICD-10-CM

## 2021-10-09 DIAGNOSIS — D124 Benign neoplasm of descending colon: Secondary | ICD-10-CM | POA: Diagnosis not present

## 2021-10-09 DIAGNOSIS — K317 Polyp of stomach and duodenum: Secondary | ICD-10-CM

## 2021-10-09 DIAGNOSIS — D123 Benign neoplasm of transverse colon: Secondary | ICD-10-CM

## 2021-10-09 DIAGNOSIS — D127 Benign neoplasm of rectosigmoid junction: Secondary | ICD-10-CM

## 2021-10-09 DIAGNOSIS — R12 Heartburn: Secondary | ICD-10-CM

## 2021-10-09 DIAGNOSIS — R1310 Dysphagia, unspecified: Secondary | ICD-10-CM

## 2021-10-09 DIAGNOSIS — K2950 Unspecified chronic gastritis without bleeding: Secondary | ICD-10-CM | POA: Diagnosis not present

## 2021-10-09 DIAGNOSIS — D128 Benign neoplasm of rectum: Secondary | ICD-10-CM

## 2021-10-09 DIAGNOSIS — K219 Gastro-esophageal reflux disease without esophagitis: Secondary | ICD-10-CM

## 2021-10-09 MED ORDER — SODIUM CHLORIDE 0.9 % IV SOLN: 500.0000 mL | INTRAVENOUS | Status: DC

## 2021-10-09 NOTE — Patient Instructions (Signed)
Discharge instructions given. ?Handouts on polyps and Hemorrhoids. ?Resume previous medications. ?YOU HAD AN ENDOSCOPIC PROCEDURE TODAY AT [facility]:   Refer to the procedure report that was given to you for any specific questions about what was found during the examination.  If the procedure report does not answer your questions, please call your gastroenterologist to clarify.  If you requested that your care partner not be given the details of your procedure findings, then the procedure report has been included in a sealed envelope for you to review at your convenience later. ? ?YOU SHOULD EXPECT: Some feelings of bloating in the abdomen. Passage of more gas than usual.  Walking can help get rid of the air that was put into your GI tract during the procedure and reduce the bloating. If you had a lower endoscopy (such as a colonoscopy or flexible sigmoidoscopy) you may notice spotting of blood in your stool or on the toilet paper. If you underwent a bowel prep for your procedure, you may not have a normal bowel movement for a few days. ? ?Please Note:  You might notice some irritation and congestion in your nose or some drainage.  This is from the oxygen used during your procedure.  There is no need for concern and it should clear up in a day or so. ? ?SYMPTOMS TO REPORT IMMEDIATELY: ? ?Following lower endoscopy (colonoscopy or flexible sigmoidoscopy): ? Excessive amounts of blood in the stool ? Significant tenderness or worsening of abdominal pains ? Swelling of the abdomen that is new, acute ? Fever of 100?F or higher ? ?Following upper endoscopy (EGD) ? Vomiting of blood or coffee ground material ? New chest pain or pain under the shoulder blades ? Painful or persistently difficult swallowing ? New shortness of breath ? Fever of 100?F or higher ? Black, tarry-looking stools ? ?For urgent or emergent issues, a gastroenterologist can be reached at any hour by calling [phone]. ?Do not use  MyChart messaging for urgent concerns.  ? ? ?DIET:  We do recommend a small meal at first, but then you may proceed to your regular diet.  Drink plenty of fluids but you should avoid alcoholic beverages for 24 hours. ? ?ACTIVITY:  You should plan to take it easy for the rest of today and you should NOT DRIVE or use heavy machinery until tomorrow (because of the sedation medicines used during the test).   ? ?FOLLOW UP: ?Our staff will call the number listed on your records 48-72 hours following your procedure to check on you and address any questions or concerns that you may have regarding the information given to you following your procedure. If we do not reach you, we will leave a message.  We will attempt to reach you two times.  During this call, we will ask if you have developed any symptoms of COVID 19. If you develop any symptoms (ie: fever, flu-like symptoms, shortness of breath, cough etc.) before then, please call [phone].  If you test positive for Covid 19 in the 2 weeks post procedure, please call and report this information to [place].   ? ?If any biopsies were taken you will be contacted by phone or by letter within the next 1-3 weeks.  Please call us at [phone] if you have not heard about the biopsies in 3 weeks.  ? ? ?SIGNATURES/CONFIDENTIALITY: ?You and/or your care partner have signed paperwork which will be entered into your electronic medical record.  These signatures attest  to the fact that that the information above on your After Visit Summary has been reviewed and is understood.  Full responsibility of the confidentiality of this discharge information lies with you and/or your care-partner.  ?

## 2021-10-09 NOTE — Progress Notes (Signed)
Called to room to assist during endoscopic procedure.  Patient ID and intended procedure confirmed with present staff. Received instructions for my participation in the procedure from the performing physician.  

## 2021-10-09 NOTE — Progress Notes (Signed)
? ?GASTROENTEROLOGY PROCEDURE H&P NOTE  ? ?Primary Care Physician: ?Yvone Neu, MD ? ?HPI: ?Kyle Nixon is a 76 y.o. male who presents for EGD/Colonoscopy to evaluate chronic GERD symptoms with breakthrough, upper abdominal pain, postprandial nausea, belching, surveillance for history of colon polyps. ? ?Past Medical History:  ?Diagnosis Date  ? Arthritis   ? Hands  ? BPH (benign prostatic hyperplasia)   ? Elevated cholesterol   ? Elevated PSA   ? GERD (gastroesophageal reflux disease)   ? Kidney stone   ? surgery to removed  ? ?Past Surgical History:  ?Procedure Laterality Date  ? BACK SURGERY    ? x3 at [facility]  ? COLONOSCOPY    ? hx polyps  ? EYE SURGERY    ? x 6, 3 on right , 3 on left  ? PARS PLANA VITRECTOMY    ? x 2  ? removal of kidney stone    ? SUPRAPUBIC PROSTATECTOMY    ? ?Current Outpatient Medications  ?Medication Sig Dispense Refill  ? atorvastatin (LIPITOR) 40 MG tablet Take 40 mg by mouth daily.    ? gemfibrozil (LOPID) 600 MG tablet Take 600 mg by mouth 2 (two) times daily before a meal.    ? OMEPRAZOLE PO Take 20 mg by mouth daily.     ? ?Current Facility-Administered Medications  ?Medication Dose Route Frequency Provider Last Rate Last Admin  ? 0.9 %  sodium chloride infusion  500 mL Intravenous Continuous Mansouraty, Telford Nab., MD      ? ? ?Current Outpatient Medications:  ?  atorvastatin (LIPITOR) 40 MG tablet, Take 40 mg by mouth daily., Disp: , Rfl:  ?  gemfibrozil (LOPID) 600 MG tablet, Take 600 mg by mouth 2 (two) times daily before a meal., Disp: , Rfl:  ?  OMEPRAZOLE PO, Take 20 mg by mouth daily. , Disp: , Rfl:  ? ?Current Facility-Administered Medications:  ?  0.9 %  sodium chloride infusion, 500 mL, Intravenous, Continuous, Mansouraty, Telford Nab., MD ?No Known Allergies ?Family History  ?Problem Relation Age of Onset  ? Heart disease Mother   ? Heart disease Father   ? Colon cancer Neg Hx   ? Pancreatic cancer Neg Hx   ? Liver cancer Neg Hx   ? Esophageal cancer Neg Hx    ? Prostate cancer Neg Hx   ? Rectal cancer Neg Hx   ? ?Social History  ? ?Socioeconomic History  ? Marital status: Widowed  ?  Spouse name: Not on file  ? Number of children: 1  ? Years of education: Not on file  ? Highest education level: Not on file  ?Occupational History  ? Not on file  ?Tobacco Use  ? Smoking status: Former  ?  Packs/day: 1.00  ?  Years: 6.00  ?  Pack years: 6.00  ?  Types: Cigarettes  ?  Quit date: 1969  ?  Years since quitting: 54.1  ? Smokeless tobacco: Never  ?Vaping Use  ? Vaping Use: Never used  ?Substance and Sexual Activity  ? Alcohol use: Yes  ?  Comment: every evening-drinks beer  ? Drug use: Never  ? Sexual activity: Not Currently  ?  Partners: Female  ?Other Topics Concern  ? Not on file  ?Social History Narrative  ? Not on file  ? ?Social Determinants of Health  ? ?Financial Resource Strain: Not on file  ?Food Insecurity: Not on file  ?Transportation Needs: Not on file  ?Physical Activity: Not on file  ?  Stress: Not on file  ?Social Connections: Not on file  ?Intimate Partner Violence: Not on file  ? ? ?Physical Exam: ?Today's Vitals  ? 10/09/21 1313 10/09/21 1316  ?BP: (!) 146/80   ?Pulse: 63   ?Temp: 98.9 ?F (37.2 ?C) 98.9 ?F (37.2 ?C)  ?SpO2: 96%   ?Weight: 252 lb (114.3 kg)   ?Height: 5\' 8"  (1.727 m)   ? ?Body mass index is 38.32 kg/m?. ?GEN: NAD ?EYE: Sclerae anicteric ?ENT: MMM ?CV: Non-tachycardic ?GI: Soft, NT/ND ?NEURO:  Alert & Oriented x 3 ? ?Lab Results: ?No results for input(s): WBC, HGB, HCT, PLT in the last 72 hours. ?BMET ?No results for input(s): NA, K, CL, CO2, GLUCOSE, BUN, CREATININE, CALCIUM in the last 72 hours. ?LFT ?No results for input(s): PROT, ALBUMIN, AST, ALT, ALKPHOS, BILITOT, BILIDIR, IBILI in the last 72 hours. ?PT/INR ?No results for input(s): LABPROT, INR in the last 72 hours. ? ? ?Impression / Plan: ?This is a 76 y.o.male who presents for EGD/Colonoscopy to evaluate chronic GERD symptoms with breakthrough, upper abdominal pain, postprandial  nausea, belching, surveillance for history of colon polyps. ? ?The risks and benefits of endoscopic evaluation/treatment were discussed with the patient and/or family; these include but are not limited to the risk of perforation, infection, bleeding, missed lesions, lack of diagnosis, severe illness requiring hospitalization, as well as anesthesia and sedation related illnesses.  The patient's history has been reviewed, patient examined, no change in status, and deemed stable for procedure.  The patient and/or family is agreeable to proceed.  ? ? ?Justice Britain, MD ?[facility] ?Advanced Endoscopy ?Office # [phone] ? ?

## 2021-10-09 NOTE — Op Note (Signed)
[facility] ?Patient Name: Kyle Nixon ?Procedure Date: 10/09/2021 2:07 PM ?MRN: [id number] ?Endoscopist: Justice Britain , MD ?Age: 76 ?Referring MD:  ?Date of Birth: 1946/03/21 ?Gender: Male ?Account #: [id number] ?Procedure:                Upper GI endoscopy ?Indications:              Epigastric abdominal pain, Heartburn, Screening for  ?                          Barrett's esophagus in patient at risk for this  ?                          condition ?Medicines:                Monitored Anesthesia Care ?Procedure:                Pre-Anesthesia Assessment: ?                          - Prior to the procedure, a History and Physical  ?                          was performed, and patient medications and  ?                          allergies were reviewed. The patient's tolerance of  ?                          previous anesthesia was also reviewed. The risks  ?                          and benefits of the procedure and the sedation  ?                          options and risks were discussed with the patient.  ?                          All questions were answered, and informed consent  ?                          was obtained. Prior Anticoagulants: The patient has  ?                          taken no previous anticoagulant or antiplatelet  ?                          agents. ASA Grade Assessment: II - A patient with  ?                          mild systemic disease. After reviewing the risks  ?                          and benefits, the patient was deemed in  ?  satisfactory condition to undergo the procedure. ?                          After obtaining informed consent, the endoscope was  ?                          passed under direct vision. Throughout the  ?                          procedure, the patient's blood pressure, pulse, and  ?                          oxygen saturations were monitored continuously. The  ?                          GIF [id number] #[id number] was introduced through  the  ?                          mouth, and advanced to the second part of duodenum.  ?                          The upper GI endoscopy was accomplished without  ?                          difficulty. The patient tolerated the procedure. ?Scope In: ?Scope Out: ?Findings:                 No gross mucosal lesions were noted in the entire  ?                          esophagus. ?                          The Z-line was irregular and was found 41 cm from  ?                          the incisors. ?                          Patchy mildly erythematous mucosa without bleeding  ?                          was found in the entire examined stomach. Biopsies  ?                          were taken with a cold forceps for histology and  ?                          Helicobacter pylori testing. ?                          No gross lesions were noted in the duodenal bulb,  ?                          in the first portion of  the duodenum and in the  ?                          second portion of the duodenum. Biopsies were taken  ?                          with a cold forceps for histology. ?Complications:            No immediate complications. ?Estimated Blood Loss:     Estimated blood loss was minimal. ?Impression:               - No gross lesions in esophagus. Z-line irregular,  ?                          41 cm from the incisors. ?                          - Erythematous mucosa in the stomach. Biopsied. ?                          - No gross lesions in the duodenal bulb, in the  ?                          first portion of the duodenum and in the second  ?                          portion of the duodenum. Biopsied. ?Recommendation:           - Proceed to scheduled colonoscopy. ?                          - Continue present medications. ?                          - Await pathology results. ?                          - The findings and recommendations were discussed  ?                          with the patient. ?Justice Britain,  MD ?10/09/2021 2:49:05 PM ?

## 2021-10-09 NOTE — Op Note (Signed)
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                          on last colonoscopy (< 3 yrs) ?Medicines:                Monitored Anesthesia Care ?Procedure:                Pre-Anesthesia Assessment: ?                          - Prior to the procedure, a History and Physical  ?                          was performed, and patient medications and  ?                          allergies were reviewed. The patient's tolerance of  ?                          previous anesthesia was also reviewed. The risks  ?                          and benefits of the procedure and the sedation  ?                          options and risks were discussed with the patient.  ?                          All questions were answered, and informed consent  ?                          was obtained. Prior Anticoagulants: The patient has  ?                          taken no previous anticoagulant or antiplatelet  ?                          agents. ASA Grade Assessment: II - A patient with  ?                          mild systemic disease. After reviewing the risks  ?                          and benefits, the patient was deemed in  ?                          satisfactory condition to undergo the procedure. ?  After obtaining informed consent, the colonoscope  ?                          was passed under direct vision. Throughout the  ?                          procedure, the patient's blood pressure, pulse, and  ?                          oxygen saturations were monitored continuously. The  ?                          CF [id number] #[id number] was introduced through the anus  ?                          and advanced to the 5 cm  into the ileum. The  ?                          colonoscopy was performed without difficulty. The  ?                          patient tolerated the procedure. The quality of the  ?                          bowel preparation was adequate. The terminal ileum,  ?                          ileocecal valve, appendiceal orifice, and rectum  ?                          were photographed. ?Scope In: 2:25:06 PM ?Scope Out: 2:44:10 PM ?Scope Withdrawal Time: 0 hours 16 minutes 36 seconds  ?Total Procedure Duration: 0 hours 19 minutes 4 seconds  ?Findings:                 The digital rectal exam findings include  ?                          hemorrhoids. Pertinent negatives include no  ?                          palpable rectal lesions. ?                          The terminal ileum and ileocecal valve appeared  ?                          normal. ?                          Nine, sessile polyps were found in the rectum (2),  ?                          recto-sigmoid colon (2), descending colon (1),  ?  transverse colon (3) and ascending colon (1). The  ?                          polyps were 2 to 5 mm in size. These polyps were  ?                          removed with a cold snare. Resection and retrieval  ?                          were complete. ?                          Normal mucosa was found in the entire colon  ?                          otherwise. ?                          Non-bleeding non-thrombosed external and internal  ?                          hemorrhoids were found during retroflexion, during  ?                          perianal exam and during digital exam. The  ?                          hemorrhoids were Grade II (internal hemorrhoids  ?                          that prolapse but reduce spontaneously). ?Complications:            No immediate complications. ?Estimated Blood Loss:     Estimated blood loss was minimal. ?Impression:               - Hemorrhoids found on digital rectal exam. ?                           - The examined portion of the ileum was normal. ?                          - Nine, 2 to 5 mm polyps in the rectum, at the  ?                          recto-sigmoid colon, in the descending colon, in  ?                          the transverse colon and in the ascending colon,  ?                          removed with a cold snare. Resected and retrieved. ?                          - Normal mucosa in the entire examined colon  ?  otherwise. ?                          - Non-bleeding non-thrombosed external and internal  ?                          hemorrhoids. ?Recommendation:           - The patient will be observed post-procedure,  ?                          until all discharge criteria are met. ?                          - Discharge patient to home. ?                          - Patient has a contact number available for  ?                          emergencies. The signs and symptoms of potential  ?                          delayed complications were discussed with the  ?                          patient. Return to normal activities tomorrow.  ?                          Written discharge instructions were provided to the  ?                          patient. ?                          - High fiber diet. ?                          - Use FiberCon 1-2 tablets PO daily. ?                          - Continue present medications. ?                          - Await pathology results. ?                          - Repeat colonoscopy in 3 years for surveillance. ?                          - The findings and recommendations were discussed  ?                          with the patient. ?Justice Britain, MD ?10/09/2021 2:53:02 PM ?

## 2021-10-09 NOTE — Progress Notes (Signed)
To pacu, VSS. Report to [email or web address] 

## 2021-10-11 ENCOUNTER — Telehealth: Payer: Self-pay

## 2021-10-11 NOTE — Telephone Encounter (Signed)
First post procedure follow up call, no answer 

## 2021-10-11 NOTE — Telephone Encounter (Signed)
?  Follow up Call- ? ?Call back number 10/09/2021 07/18/2020  ?Post procedure Call Back phone  # [phone]  ?Permission to leave phone message Yes Yes  ?Some recent data might be hidden  ?  ? ?Patient questions: ? ?Do you have a fever, pain , or abdominal swelling? No. ?Pain Score  0 * ? ?Have you tolerated food without any problems? Yes.   ? ?Have you been able to return to your normal activities? Yes.   ? ?Do you have any questions about your discharge instructions: ?Diet   No. ?Medications  No. ?Follow up visit  No. ? ?Do you have questions or concerns about your Care? No. ? ?Actions: ?* If pain score is 4 or above: ?No action needed, pain <4. ? ? ?

## 2021-10-14 ENCOUNTER — Encounter: Payer: Self-pay | Admitting: Gastroenterology

## 2021-10-15 ENCOUNTER — Other Ambulatory Visit: Payer: Self-pay

## 2021-10-15 DIAGNOSIS — A048 Other specified bacterial intestinal infections: Secondary | ICD-10-CM

## 2023-06-09 ENCOUNTER — Telehealth: Payer: Self-pay | Admitting: Gastroenterology

## 2023-06-09 NOTE — Telephone Encounter (Signed)
Patient called and stated he was gassy, bloating, diarrehea and constipation. He also does not agree with the recall date please advise.

## 2023-06-10 NOTE — Telephone Encounter (Signed)
Pt complains of hiccups, gas, bloating and change in bowel habits for the past 6 months.  No bleeding with BM.  Some abd discomfort above the belly button that comes and goes. Food "does not digest"  Does not think any particular type of foods makes it worse or better.  Takes omeprazole 20 mg BID with no relief.    He has been set up to see Hyacinth Meeker PA on 11/6 at 9 am. Pt advised to arrive at 845 am with ID, med list and insurance card.  He will also try adding Pepcid at bedtime and anti reflux precautions discussed. He does note he drinks soda daily- I have asked him to avoid sodas for now.  He does agree.

## 2023-06-17 ENCOUNTER — Encounter: Payer: Self-pay | Admitting: Physician Assistant

## 2023-06-17 ENCOUNTER — Other Ambulatory Visit: Payer: Medicare Other

## 2023-06-17 ENCOUNTER — Ambulatory Visit (INDEPENDENT_AMBULATORY_CARE_PROVIDER_SITE_OTHER): Payer: Medicare Other | Admitting: Physician Assistant

## 2023-06-17 ENCOUNTER — Encounter (HOSPITAL_COMMUNITY): Payer: Self-pay | Admitting: Internal Medicine

## 2023-06-17 VITALS — BP 126/72 | HR 66 | Ht 70.0 in | Wt 258.0 lb

## 2023-06-17 DIAGNOSIS — Z860100 Personal history of colon polyps, unspecified: Secondary | ICD-10-CM

## 2023-06-17 DIAGNOSIS — R140 Abdominal distension (gaseous): Secondary | ICD-10-CM | POA: Diagnosis not present

## 2023-06-17 DIAGNOSIS — R197 Diarrhea, unspecified: Secondary | ICD-10-CM | POA: Diagnosis not present

## 2023-06-17 DIAGNOSIS — R1310 Dysphagia, unspecified: Secondary | ICD-10-CM

## 2023-06-17 MED ORDER — OMEPRAZOLE 40 MG PO CPDR: 40.0000 mg | DELAYED_RELEASE_CAPSULE | Freq: Two times a day (BID) | ORAL | 5 refills | Status: DC

## 2023-06-17 NOTE — Patient Instructions (Signed)
Your provider has requested that you go to the basement level for lab work before leaving today. Press "B" on the elevator. The lab is located at the first door on the left as you exit the elevator.  We have sent the following medications to your pharmacy for you to pick up at your convenience: omeprazole 40 mg twice daily.  You have been scheduled for a Barium Esophogram at [facility] (1st floor of the hospital) on 06/23/23 at 10:00am. Please arrive 30 minutes prior to your appointment for registration. Make certain not to have anything to eat or drink 3 hours prior to your test. If you need to reschedule for any reason, please contact radiology at [phone] to do so. __________________________________________________________________ A barium swallow is an examination that concentrates on views of the esophagus. This tends to be a double contrast exam (barium and two liquids which, when combined, create a gas to distend the wall of the oesophagus) or single contrast (non-ionic iodine based). The study is usually tailored to your symptoms so a good history is essential. Attention is paid during the study to the form, structure and configuration of the esophagus, looking for functional disorders (such as aspiration, dysphagia, achalasia, motility and reflux) EXAMINATION You may be asked to change into a gown, depending on the type of swallow being performed. A radiologist and radiographer will perform the procedure. The radiologist will advise you of the type of contrast selected for your procedure and direct you during the exam. You will be asked to stand, sit or lie in several different positions and to hold a small amount of fluid in your mouth before being asked to swallow while the imaging is performed .In some instances you may be asked to swallow barium coated marshmallows to assess the motility of a solid food bolus. The exam can be recorded as a digital or video fluoroscopy  procedure. POST PROCEDURE It will take 1-2 days for the barium to pass through your system. To facilitate this, it is important, unless otherwise directed, to increase your fluids for the next 24-48hrs and to resume your normal diet.  This test typically takes about 30 minutes to perform. _______________________________________________________________________    If your blood pressure at your visit was 140/90 or greater, please contact your primary care physician to follow up on this.  _______________________________________________________  If you are age 75 or older, your body mass index should be between 23-30. Your Body mass index is 37.02 kg/m. If this is out of the aforementioned range listed, please consider follow up with your Primary Care Provider.  If you are age 69 or younger, your body mass index should be between 19-25. Your Body mass index is 37.02 kg/m. If this is out of the aformentioned range listed, please consider follow up with your Primary Care Provider.   ________________________________________________________  The [facility] providers would like to encourage you to use [facility] to communicate with providers for non-urgent requests or questions.  Due to long hold times on the telephone, sending your provider a message by [facility] may be a faster and more efficient way to get a response.  Please allow 48 business hours for a response.  Please remember that this is for non-urgent requests.  _______________________________________________________

## 2023-06-17 NOTE — Progress Notes (Signed)
Attending Physician's Attestation   I have reviewed the chart.   I agree with the Advanced Practitioner's note, impression, and recommendations with any updates as below. Can see barium esophagram.  Can also consider empiric dilation based on results even if not overt stricture.  Manometry thereafter is reasonable for dysphagia.  In regards to additional testing, agree we need to rule out Celiac disease.  Colonoscopy timing is more a result of having less than 10 polyps on his last procedure.  I think we can keep his 3-year plan for 2026, but if further concerns, as he has had a great number of precancerous polyps, repeat in 2025 could be considered   Corliss Parish, MD [facility] Advanced Endoscopy Office # [phone]

## 2023-06-17 NOTE — Progress Notes (Signed)
Chief Complaint: Gas, bloating and change in bowel habits  HPI:    Kyle Nixon is a 77 year old male, known to Dr. Meridee Score, with a past medical history as listed below, who was referred to me by Hungarland, Carlena Bjornstad,* for a complaint of gas, bloating and change in bowel habits.    10/09/2021 colonoscopy with hemorrhoids, 9 to-5 mm polyps in the rectum, rectosigmoid colon, descending colon, transverse colon and ascending colon and nonbleeding nonthrombosed external and internal hemorrhoids.  Biopsies showed sessile serrated, tubular adenoma and hyperplastic polyp.  Patient told to repeat colonoscopy in 3 years.    10/09/2021 EGD for screening for Barrett's and heartburn with epigastric pain with erythematous mucosa in the stomach and otherwise normal.  Biopsies suggested possible celiac disease.  With mild villous blunting.  At that time recommended other labs including ANA, TTG IgA, IgA level, H. pylori IgG and H. pylori IgM.  Also recommended H. pylori stool antigen.    Today, patient tells me that really ever since time of his colonoscopy last year he has had a lot of gas and belching and feels like food sometimes just does not want to go down.  He is chronically on Omeprazole 20 mg daily but sometimes takes it twice daily.  Over the past 3 to 4 months has also noticed some trouble when swallowing food feeling like it gets stuck almost like someone has "their thumb on my throat" and is not allowing food to go down.  Usually does typically pass.      Describes an acute instance a week or 2 ago when he had some diarrhea and then a bowel movement with a lot of diarrhea and what seemed like a hard chunk/"clot" of stool that would not pass without manually disimpacting himself.  He tells me this has never happened before in his life.  He has daily bowel movements and never feels constipated.  It has not recurred again over the past 2 to 3 weeks.    Denies fever, chills, blood in the stool or weight  loss.  Past Medical History:  Diagnosis Date   Arthritis    Hands   BPH (benign prostatic hyperplasia)    Elevated cholesterol    Elevated PSA    GERD (gastroesophageal reflux disease)    Kidney stone    surgery to removed    Past Surgical History:  Procedure Laterality Date   BACK SURGERY     x3 at [facility]   COLONOSCOPY     hx polyps   EYE SURGERY     x 6, 3 on right , 3 on left   PARS PLANA VITRECTOMY     x 2   removal of kidney stone     SUPRAPUBIC PROSTATECTOMY      Current Outpatient Medications  Medication Sig Dispense Refill   atorvastatin (LIPITOR) 40 MG tablet Take 40 mg by mouth daily.     gemfibrozil (LOPID) 600 MG tablet Take 600 mg by mouth 2 (two) times daily before a meal.     meloxicam (MOBIC) 15 MG tablet Take 15 mg by mouth daily.     OMEPRAZOLE PO Take 20 mg by mouth daily.      No current facility-administered medications for this visit.    Allergies as of 06/17/2023   (No Known Allergies)    Family History  Problem Relation Age of Onset   Heart disease Mother    Heart disease Father    Colon cancer Neg Hx  Pancreatic cancer Neg Hx    Liver cancer Neg Hx    Esophageal cancer Neg Hx    Prostate cancer Neg Hx    Rectal cancer Neg Hx     Social History   Socioeconomic History   Marital status: Widowed    Spouse name: Not on file   Number of children: 1   Years of education: Not on file   Highest education level: Not on file  Occupational History   Not on file  Tobacco Use   Smoking status: Former    Current packs/day: 0.00    Average packs/day: 1 pack/day for 6.0 years (6.0 ttl pk-yrs)    Types: Cigarettes    Start date: 69    Quit date: 1969    Years since quitting: 55.8   Smokeless tobacco: Never  Vaping Use   Vaping status: Never Used  Substance and Sexual Activity   Alcohol use: Yes    Comment: every evening-drinks beer   Drug use: Never   Sexual activity: Not Currently    Partners: Female  Other Topics Concern    Not on file  Social History Narrative   Not on file   Social Determinants of Health   Financial Resource Strain: Low Risk  (07/14/2022)   Received from [facility]   Overall Financial Resource Strain (CARDIA)    Difficulty of Paying Living Expenses: Not hard at all  Food Insecurity: No Food Insecurity (07/14/2022)   Received from [facility]   Hunger Vital Sign    Worried About Running Out of Food in the Last Year: Never true    Ran Out of Food in the Last Year: Never true  Transportation Needs: No Transportation Needs (07/14/2022)   Received from [facility] - Transportation    In the past 12 months, has lack of transportation kept you from medical appointments or from getting medications?: No    Lack of Transportation (Non-Medical): No  Physical Activity: Not on file  Stress: Not on file  Social Connections: Not on file  Intimate Partner Violence: Not on file    Review of Systems:    Constitutional: No weight loss, fever or chills Cardiovascular: No chest pain Respiratory: No SOB  Gastrointestinal: See HPI and otherwise negative   Physical Exam:  Vital signs: BP 126/72   Pulse 66   Ht 5\' 10"  (1.778 m)   Wt 258 lb (117 kg)   BMI 37.02 kg/m    Constitutional:   Pleasant overweight Caucasian male appears to be in NAD, Well developed, Well nourished, alert and cooperative Respiratory: Respirations even and unlabored. Lungs clear to auscultation bilaterally.   No wheezes, crackles, or rhonchi.  Cardiovascular: Normal S1, S2. No MRG. Regular rate and rhythm. No peripheral edema, cyanosis or pallor.  Gastrointestinal:  Soft, nondistended, nontender. No rebound or guarding. Normal bowel sounds. No appreciable masses or hepatomegaly. Rectal:  Not performed.  Psychiatric: Oriented to person, place and time. Demonstrates good judgement and reason without abnormal affect or behaviors.  RELEVANT LABS AND IMAGING: CBC     Component Value Date/Time   WBC 7.6 08/26/2018 1614   RBC 4.57 08/26/2018 1614   HGB 14.5 08/26/2018 1614   HCT 41.5 08/26/2018 1614   PLT 252.0 08/26/2018 1614   MCV 90.8 08/26/2018 1614   MCHC 34.9 08/26/2018 1614   RDW 13.4 08/26/2018 1614    CMP     Component Value Date/Time   NA 138  08/26/2018 1614   K 4.1 08/26/2018 1614   CL 103 08/26/2018 1614   CO2 27 08/26/2018 1614   GLUCOSE 107 (H) 08/26/2018 1614   BUN 19 08/26/2018 1614   CREATININE 1.10 08/26/2018 1614   CALCIUM 10.0 08/26/2018 1614   PROT 7.1 08/26/2018 1614   ALBUMIN 4.5 08/26/2018 1614   AST 19 08/26/2018 1614   ALT 23 08/26/2018 1614   ALKPHOS 70 08/26/2018 1614   BILITOT 0.7 08/26/2018 1614    Assessment: 1.  Change in bowel habits: Apparently had 1 episode of acute constipation, otherwise has normal bowel movements; uncertain etiology but it has not recurred, variance could be related to celiac disease 2.  Gas and bloating: Constant per the patient regardless of Omeprazole 20 mg daily, EGD with gastritis and question of celiac disease last year, never followed up for labs; consider celiac disease versus gastritis versus SIBO versus other 3.  Dysphagia: Increasing symptoms over the past 3 to 4 months, EGD in March 2023 with no sign of stricture; consider dysmotility versus stricture versus other 4.  History of polyps: Last colonoscopy with 9 polyps, hyperplastic, adenomatous and sessile serrated, repeat recommended in 3 years per report  Plan: 1.  Ordered labs as recommended by Dr. Meridee Score after last EGD for further evaluation of possible celiac disease given villous blunting. 2.  Discussed the possibility of celiac disease with the patient today.  This could be the reason for a lot of his symptoms. 3.  Increase Omeprazole to 40 mg twice daily, 30-60 minutes before breakfast and dinner.  #60 with 5 refills. 4.  Ordered a barium esophagram with tablet for further evaluation of describe dysphagia. 5.   Patient was concerned that his next colonoscopy is not recommended for 3 years, 2026 as he has had 1 every other year given his polyps.  I will confirm with Dr. Meridee Score. 6.  Patient to follow in clinic with me in 2 to 3 months or sooner if necessary.  Hyacinth Meeker, PA-C [facility] 06/17/2023, 9:15 AM  Cc: Hungarland, Carlena Bjornstad,*

## 2023-06-21 LAB — ANTI-NUCLEAR AB-TITER (ANA TITER)
ANA TITER: 1:40 {titer} — ABNORMAL HIGH
ANA Titer 1: 1:40 {titer} — ABNORMAL HIGH

## 2023-06-21 LAB — ANA: Anti Nuclear Antibody (ANA): POSITIVE — AB

## 2023-06-21 LAB — TISSUE TRANSGLUTAMINASE, IGA: (tTG) Ab, IgA: <1 U/mL

## 2023-06-21 LAB — IGA: Immunoglobulin A: 206 mg/dL (ref 70–320)

## 2023-06-23 ENCOUNTER — Ambulatory Visit (HOSPITAL_COMMUNITY)
Admission: RE | Admit: 2023-06-23 | Discharge: 2023-06-23 | Disposition: A | Discharge status: Home / Self Care | Payer: Medicare Other | Source: Ambulatory Visit | Attending: Physician Assistant | Admitting: Physician Assistant

## 2023-06-23 DIAGNOSIS — R1310 Dysphagia, unspecified: Secondary | ICD-10-CM | POA: Insufficient documentation

## 2023-06-24 ENCOUNTER — Other Ambulatory Visit: Payer: Self-pay | Admitting: *Deleted

## 2023-06-26 ENCOUNTER — Other Ambulatory Visit: Payer: Self-pay

## 2023-06-26 DIAGNOSIS — K224 Dyskinesia of esophagus: Secondary | ICD-10-CM

## 2023-07-01 ENCOUNTER — Telehealth (HOSPITAL_COMMUNITY): Payer: Self-pay | Admitting: Physician Assistant

## 2023-07-01 NOTE — Telephone Encounter (Signed)
Pt stated he had called Hyacinth Meeker, PA office to express he did not want the OP MBS and verbally declined the appt. this morning. AHARRIS

## 2024-04-18 ENCOUNTER — Other Ambulatory Visit: Payer: Self-pay | Admitting: Physician Assistant

## 2024-06-01 ENCOUNTER — Other Ambulatory Visit: Payer: Self-pay

## 2024-06-01 ENCOUNTER — Encounter (HOSPITAL_BASED_OUTPATIENT_CLINIC_OR_DEPARTMENT_OTHER): Payer: Self-pay | Admitting: Orthopaedic Surgery

## 2024-06-06 NOTE — H&P (Signed)
 ORTHOPAEDIC SURGERY H&P  Subjective:  The patient presents for left great toe nail excision with ablation of nail bed.   Past Medical History:  Diagnosis Date   Arthritis    Hands   BPH (benign prostatic hyperplasia)    Elevated cholesterol    Elevated PSA    GERD (gastroesophageal reflux disease)    HTN (hypertension)    Kidney stone    surgery to removed   OSA (obstructive sleep apnea)     Past Surgical History:  Procedure Laterality Date   BACK SURGERY     x3 at [facility]   COLONOSCOPY     hx polyps   EYE SURGERY     x 6, 3 on right , 3 on left   PARS PLANA VITRECTOMY     x 2   removal of kidney stone     SUPRAPUBIC PROSTATECTOMY       (Not in an outpatient encounter)    No Known Allergies  Social History   Socioeconomic History   Marital status: Widowed    Spouse name: Not on file   Number of children: 1   Years of education: Not on file   Highest education level: Not on file  Occupational History   Not on file  Tobacco Use   Smoking status: Former    Current packs/day: 0.00    Average packs/day: 1 pack/day for 6.0 years (6.0 ttl pk-yrs)    Types: Cigarettes    Start date: 42    Quit date: 87    Years since quitting: 56.8   Smokeless tobacco: Never  Vaping Use   Vaping status: Never Used  Substance and Sexual Activity   Alcohol use: Not Currently    Comment: every evening-drinks beer   Drug use: Never   Sexual activity: Not Currently    Partners: Female  Other Topics Concern   Not on file  Social History Narrative   Not on file   Social Drivers of Health   Financial Resource Strain: Low Risk  (07/14/2022)   Received from [facility]   Overall Financial Resource Strain (CARDIA)    Difficulty of Paying Living Expenses: Not hard at all  Food Insecurity: No Food Insecurity (07/14/2022)   Received from [facility]   Hunger Vital Sign    Within the past 12 months, you worried that your food would run out  before you got the money to buy more.: Never true    Within the past 12 months, the food you bought just didn't last and you didn't have money to get more.: Never true  Transportation Needs: No Transportation Needs (07/14/2022)   Received from [facility] - Transportation    In the past 12 months, has lack of transportation kept you from medical appointments or from getting medications?: No    Lack of Transportation (Non-Medical): No  Physical Activity: Not on file  Stress: Not on file  Social Connections: Not on file  Intimate Partner Violence: Not on file     History reviewed. No pertinent family history.   Review of Systems Pertinent items are noted in HPI.  Objective: Vital signs in last 24 hours:    06/01/2024   12:54 PM 06/17/2023    9:07 AM 10/09/2021    3:09 PM  Vitals with BMI  Height 5' 10 5' 10   Weight 260 lbs 258 lbs   BMI 37.31 37.02   Systolic  126 131  Diastolic  72 79  Pulse  66 60      EXAM: General: Well nourished, well developed. Awake, alert and oriented to time, place, person. Normal mood and affect. No apparent distress. Breathing room air.  Operative Lower Extremity: Alignment - Neutral Deformity - None Skin intact Tenderness to palpation - left great toe nail 5/5 TA, PT, GS, Per, EHL, FHL Sensation intact to light touch throughout Palpable DP and PT pulses Special testing: None  The contralateral foot/ankle was examined for comparison and noted to be neurovascularly intact with no localized deformity, swelling, or tenderness.  Imaging Review All images taken were independently reviewed by me.  Assessment/Plan: The clinical and radiographic findings were reviewed and discussed at length with the patient.  The patient presents for left great toe nail excision with ablation of nail bed.  We spoke at length about the natural course of these findings. We discussed nonoperative and operative treatment options in  detail.  The risks and benefits were presented and reviewed. The risks due to suture/hardware failure/irritation (or if removing hardware inability to remove part/all of hardware, recurrent instability), new/persistent/recurrent infection, stiffness, nerve/vessel/tendon injury, nonunion/malunion of any fracture, wound healing issues, allograft usage, development of arthritis, failure of this surgery, possibility of external fixation in certain situations, possibility of delayed definitive surgery, need for further surgery, prolonged wound care including further soft tissue coverage procedures, thromboembolic events, anesthesia/medical complications/events perioperatively and beyond, amputation, death among others were discussed. The patient acknowledged the explanation and agreed to proceed with the plan.  Lillia Mountain  Orthopaedic Surgery [facility]

## 2024-06-06 NOTE — Discharge Instructions (Signed)
 Lillia Mountain, MD [facility]  Please read the following information regarding your care after surgery.  Medications   - Oxycodone 5 mg every 6-8 hours as needed for pain - Aspirin 81 mg twice daily as scheduled to prevent blood clots - Colace 100 mg twice daily as needed for constipation - Zofran 4 mg every 8 hours as needed for nausea/vomiting  We send above prescriptions to your pharmacy on file.  In addition you may also use: ? acetaminophen (Tylenol) 500 mg every 4-6 hours as you need for minor to moderate pain  Resume all other routine medications per usual or as directed by your PCP/other specialists.  ? To help prevent blood clots, you should also get up every hour while you are awake to move around.  Weight Bearing ? OK to walk on the operative leg only AFTER the nerve block has completely worn off.  Cast / Splint / Dressing ? Keep your dressing clean and dry.  Don't put anything (coat hanger, pencil, etc) down inside of it.  If it gets wet, please notify the office immediately.  Swelling IMPORTANT: It is normal for you to have swelling where you had surgery. To reduce swelling and pain, keep at least 3 pillows under your leg so that your toes are above your nose and your heel is above the level of your hip.  It may be necessary to keep your foot or leg elevated for several weeks.  This is critical to helping your incisions heal and your pain to feel better.  Follow Up Call my office at [phone] when you are discharged from the hospital or surgery center to schedule an appointment to be seen within 7-10 days after surgery.  Call my office at [phone] if you develop a fever >101.5 F, nausea, vomiting, bleeding from the surgical site or severe pain.     Post Anesthesia Home Care Instructions  Activity: Get plenty of rest for the remainder of the day. A responsible individual must stay with you for 24 hours following the procedure.  For the next 24 hours, DO  NOT: -Drive a car -Advertising copywriter -Drink alcoholic beverages -Take any medication unless instructed by your physician -Make any legal decisions or sign important papers.  Meals: Start with liquid foods such as gelatin or soup. Progress to regular foods as tolerated. Avoid greasy, spicy, heavy foods. If nausea and/or vomiting occur, drink only clear liquids until the nausea and/or vomiting subsides. Call your physician if vomiting continues.  Special Instructions/Symptoms: Your throat may feel dry or sore from the anesthesia or the breathing tube placed in your throat during surgery. If this causes discomfort, gargle with warm salt water. The discomfort should disappear within 24 hours.  Regional Anesthesia Blocks  1. You may not be able to move or feel the blocked extremity after a regional anesthetic block. This may last may last from 3-48 hours after placement, but it will go away. The length of time depends on the medication injected and your individual response to the medication. As the nerves start to wake up, you may experience tingling as the movement and feeling returns to your extremity. If the numbness and inability to move your extremity has not gone away after 48 hours, please call your surgeon.   2. The extremity that is blocked will need to be protected until the numbness is gone and the strength has returned. Because you cannot feel it, you will need to take extra care to avoid injury. Because it may  be weak, you may have difficulty moving it or using it. You may not know what position it is in without looking at it while the block is in effect.  3. For blocks in the legs and feet, returning to weight bearing and walking needs to be done carefully. You will need to wait until the numbness is entirely gone and the strength has returned. You should be able to move your leg and foot normally before you try and bear weight or walk. You will need someone to be with you when you  first try to ensure you do not fall and possibly risk injury.  4. Bruising and tenderness at the needle site are common side effects and will resolve in a few days.  5. Persistent numbness or new problems with movement should be communicated to the surgeon or the [facility] [phone] [facility] [phone]).  Next dose of tylenol after 2:30pm

## 2024-06-07 ENCOUNTER — Ambulatory Visit: Payer: Self-pay | Admitting: Gastroenterology

## 2024-06-07 ENCOUNTER — Encounter: Payer: Self-pay | Admitting: Gastroenterology

## 2024-06-07 ENCOUNTER — Other Ambulatory Visit

## 2024-06-07 ENCOUNTER — Ambulatory Visit (INDEPENDENT_AMBULATORY_CARE_PROVIDER_SITE_OTHER): Admitting: Gastroenterology

## 2024-06-07 VITALS — BP 130/70 | HR 68 | Ht 68.25 in | Wt 257.4 lb

## 2024-06-07 DIAGNOSIS — K219 Gastro-esophageal reflux disease without esophagitis: Secondary | ICD-10-CM

## 2024-06-07 DIAGNOSIS — K625 Hemorrhage of anus and rectum: Secondary | ICD-10-CM

## 2024-06-07 DIAGNOSIS — R194 Change in bowel habit: Secondary | ICD-10-CM

## 2024-06-07 DIAGNOSIS — Z860100 Personal history of colon polyps, unspecified: Secondary | ICD-10-CM

## 2024-06-07 DIAGNOSIS — R151 Fecal smearing: Secondary | ICD-10-CM | POA: Diagnosis not present

## 2024-06-07 DIAGNOSIS — K649 Unspecified hemorrhoids: Secondary | ICD-10-CM

## 2024-06-07 DIAGNOSIS — K6289 Other specified diseases of anus and rectum: Secondary | ICD-10-CM

## 2024-06-07 DIAGNOSIS — R1319 Other dysphagia: Secondary | ICD-10-CM

## 2024-06-07 LAB — CBC WITH DIFFERENTIAL/PLATELET
Basophils Absolute: 0.1 K/uL (ref 0.0–0.1)
Basophils Relative: 1.1 % (ref 0.0–3.0)
Eosinophils Absolute: 0.3 K/uL (ref 0.0–0.7)
Eosinophils Relative: 3.9 % (ref 0.0–5.0)
HCT: 44 % (ref 39.0–52.0)
Hemoglobin: 15.1 g/dL (ref 13.0–17.0)
Lymphocytes Relative: 27.5 % (ref 12.0–46.0)
Lymphs Abs: 1.9 K/uL (ref 0.7–4.0)
MCHC: 34.3 g/dL (ref 30.0–36.0)
MCV: 91.9 fl (ref 78.0–100.0)
Monocytes Absolute: 0.5 K/uL (ref 0.1–1.0)
Monocytes Relative: 7.3 % (ref 3.0–12.0)
Neutro Abs: 4.1 K/uL (ref 1.4–7.7)
Neutrophils Relative %: 60.2 % (ref 43.0–77.0)
Platelets: 239 K/uL (ref 150.0–400.0)
RBC: 4.79 Mil/uL (ref 4.22–5.81)
RDW: 13.8 % (ref 11.5–15.5)
WBC: 6.8 K/uL (ref 4.0–10.5)

## 2024-06-07 LAB — BASIC METABOLIC PANEL WITH GFR
BUN: 18 mg/dL (ref 6–23)
CO2: 26 meq/L (ref 19–32)
Calcium: 10.1 mg/dL (ref 8.4–10.5)
Chloride: 103 meq/L (ref 96–112)
Creatinine, Ser: 1 mg/dL (ref 0.40–1.50)
GFR: 72.79 mL/min (ref >60.00)
Glucose, Bld: 140 mg/dL — ABNORMAL HIGH (ref 70–99)
Potassium: 3.9 meq/L (ref 3.5–5.1)
Sodium: 138 meq/L (ref 135–145)

## 2024-06-07 MED ORDER — HYDROCORTISONE ACETATE 25 MG RE SUPP: 25.0000 mg | Freq: Every day | RECTAL | 0 refills | Status: AC

## 2024-06-07 MED ORDER — NA SULFATE-K SULFATE-MG SULF 17.5-3.13-1.6 GM/177ML PO SOLN: 1.0000 | Freq: Once | ORAL | 0 refills | Status: AC

## 2024-06-07 NOTE — Progress Notes (Signed)
 Mathhew Buysse [id number] 11/12/1945   Chief Complaint: Hemorrhoids, rectal bleeding  Referring Provider: Donnise Norleen Alm DEWAINE Primary GI MD: Dr. Wilhelmenia  HPI: Giuliano Preece is a 78 y.o. male with past medical history of arthritis, BPH, high cholesterol, HTN, kidney stones, OSA, GERD who presents today for a complaint of hemorrhoids and rectal bleeding.     10/09/2021 colonoscopy with hemorrhoids, 9 to-5 mm polyps in the rectum, rectosigmoid colon, descending colon, transverse colon and ascending colon and nonbleeding nonthrombosed external and internal hemorrhoids.  Biopsies showed sessile serrated, tubular adenoma and hyperplastic polyp.  Patient told to repeat colonoscopy in 3 years.     10/09/2021 EGD for screening for Barrett's and heartburn with epigastric pain with erythematous mucosa in the stomach and otherwise normal.  Biopsies suggested possible celiac disease.  With mild villous blunting.  At that time recommended other labs including ANA, TTG IgA, IgA level, H. pylori IgG and H. pylori IgM.  Also recommended H. pylori stool antigen.  Last seen in office 06/17/2023 by Delon Failing, PA at which time he endorsed dysphagia, change in bowel habits with 1 episode of acute constipation that had not recurred, gas and bloating.  Labs were ordered that were previously recommended by Dr. Wilhelmenia to evaluate for possible celiac disease given villous blunting.  Increase omeprazole  to 40 mg twice daily and barium esophagram was ordered which showed mild esophageal dysmotility and otherwise normal.  Labs showed positive ANA with a low titer, negative celiac disease.   Discussed the use of AI scribe software for clinical note transcription with the patient, who gave verbal consent to proceed.  History of Present Illness Hosteen Kienast is a 78 year old male who presents with rectal bleeding and digestive issues.  He has been experiencing rectal bleeding and hemorrhoids for about  a year. The bleeding occurs when he cleans himself and irritates the hemorrhoid, describing it as 'lots of bleeding' that can fill the commode with blood. He denies using any over-the-counter treatments for hemorrhoids except for preparation H wipes.   He experiences fecal leakage and feels the need to clean himself multiple times after a bowel movement, which further irritates hemorrhoids. He typically has a bowel movement every morning after breakfast and sometimes again during the day. No hard stools, straining, diarrhea, or abdominal pain.  He has been experiencing digestive problems for about a year, describing a sensation that his food 'won't go down' and feeling full the next morning after eating dinner. He reports belching and sometimes feels like his food is coming back up. He is currently taking omeprazole  twice a day but continues to have these issues. He underwent an upper endoscopy in 2023 but does not recall if the swallowing issues were present at that time. He describes the sensation of food getting stuck at the bottom of his chest when eating. No chest pain or shortness of breath. He has not experienced any pain with swallowing, just a feeling that food is not going down well.   Previous GI Procedures/Imaging   Barium swallow 06/23/2023 1.  Mild esophageal dysmotility.   2.  Otherwise unremarkable exam.  Colonoscopy 10/09/2021 - Hemorrhoids found on digital rectal exam.  - The examined portion of the ileum was normal.  - Nine, 2 to 5 mm polyps in the rectum, at the recto- sigmoid colon, in the descending colon, in the transverse colon and in the ascending colon, removed with a cold snare. Resected and retrieved.  - Normal mucosa in the entire examined  colon otherwise.  - Non- bleeding non- thrombosed external and internal hemorrhoids. - Recall 3 years Path: 3. Surgical [P], colon, ascending, transverse, descending, recto-sigmoid and rectum, polyp (9) SESSILE SERRATED POLYP  WITHOUT CYTOLOGIC DYSPLASIA TUBULAR ADENOMA HYPERPLASTIC POLYP NEGATIVE FOR HIGH-GRADE DYSPLASIA AND CARCINOMA  EGD 10/09/2021 - No gross lesions in esophagus. Z- line irregular, 41 cm from the incisors.  - Erythematous mucosa in the stomach. Biopsied.  - No gross lesions in the duodenal bulb, in the first portion of the duodenum and in the second portion of the duodenum. Biopsied. Path: 1. Surgical [P], duodenal biopsies DUODENAL LYMPHOCYTOSIS WITH MILD VILLOUS BLUNTING (MARSH 3A) 2. Surgical [P], gastric biopsies REACTIVE GASTROPATHY WITH MILD CHRONIC GASTRITIS CHANGES COMPATIBLE WITH EARLY FUNDIC GLAND POLYP NEGATIVE FOR H. PYLORI, INTESTINAL METAPLASIA, DYSPLASIA AND CARCINOMA  Past Medical History:  Diagnosis Date   Arthritis    Hands   BPH (benign prostatic hyperplasia)    Elevated cholesterol    Elevated PSA    GERD (gastroesophageal reflux disease)    HTN (hypertension)    Kidney stone    surgery to removed   OSA (obstructive sleep apnea)     Past Surgical History:  Procedure Laterality Date   BACK SURGERY     x3 at [facility]   COLONOSCOPY     hx polyps   EYE SURGERY     x 6, 3 on right , 3 on left   PARS PLANA VITRECTOMY     x 2   removal of kidney stone     SUPRAPUBIC PROSTATECTOMY      Current Outpatient Medications  Medication Sig Dispense Refill   atorvastatin (LIPITOR) 40 MG tablet Take 40 mg by mouth daily.     gemfibrozil (LOPID) 600 MG tablet Take 600 mg by mouth 2 (two) times daily before a meal.     omeprazole  (PRILOSEC) 40 MG capsule TAKE 1 CAPSULE BY MOUTH IN THE MORNING AND AT BEDTIME 60 capsule 0   aspirin EC (ASPIRIN 81) 81 MG tablet Take 81 mg by mouth daily. (Patient not taking: Reported on 06/07/2024)     meloxicam (MOBIC) 15 MG tablet Take 15 mg by mouth daily. (Patient not taking: Reported on 06/07/2024)     No current facility-administered medications for this visit.    Allergies as of 06/07/2024   (No Known Allergies)    Family  History  Problem Relation Age of Onset   Heart disease Mother    Heart disease Father    Colon cancer Neg Hx    Pancreatic cancer Neg Hx    Liver cancer Neg Hx    Esophageal cancer Neg Hx    Prostate cancer Neg Hx    Rectal cancer Neg Hx     Social History   Tobacco Use   Smoking status: Former    Current packs/day: 0.00    Average packs/day: 1 pack/day for 6.0 years (6.0 ttl pk-yrs)    Types: Cigarettes    Start date: 1963    Quit date: 1969    Years since quitting: 56.8   Smokeless tobacco: Never  Vaping Use   Vaping status: Never Used  Substance Use Topics   Alcohol use: Not Currently    Comment: every evening-drinks beer   Drug use: Never     Review of Systems:    Constitutional: No weight loss, fever, chills, weakness  Cardiovascular: No chest pain, chest pressure or palpitations   Respiratory: No SOB Gastrointestinal: See HPI and otherwise negative  Physical Exam:  Vital signs: BP 130/70 (BP Location: Left Arm, Patient Position: Sitting, Cuff Size: Large)   Pulse 68   Ht 5' 8.25 (1.734 m) Comment: height measured without shoes  Wt 257 lb 6 oz (116.7 kg)   BMI 38.85 kg/m   Constitutional: Pleasant, obese male in NAD, alert and cooperative Head:  Normocephalic and atraumatic.  Eyes: No scleral icterus.  Respiratory: Respirations even and unlabored. Lungs clear to auscultation bilaterally.  No wheezes, crackles, or rhonchi.  Cardiovascular:  Regular rate and rhythm. No murmurs. No peripheral edema. Gastrointestinal:  Soft, nondistended, nontender. No rebound or guarding. Normal bowel sounds. No appreciable masses or hepatomegaly. Rectal: Nonbleeding, nonthrombosed external vs prolapsed internal hemorrhoids, some mild perianal skin breakdown/irritation, no abnormalities palpated on DRE, anoscopy not performed.  Chaperone present for exam. Neurologic:  Alert and oriented x4;  grossly normal neurologically.  Skin:   Dry and intact without significant lesions or  rashes. Psychiatric: Oriented to person, place and time. Demonstrates good judgement and reason without abnormal affect or behaviors.   RELEVANT LABS AND IMAGING: CBC    Component Value Date/Time   WBC 7.6 08/26/2018 1614   RBC 4.57 08/26/2018 1614   HGB 14.5 08/26/2018 1614   HCT 41.5 08/26/2018 1614   PLT 252.0 08/26/2018 1614   MCV 90.8 08/26/2018 1614   MCHC 34.9 08/26/2018 1614   RDW 13.4 08/26/2018 1614    CMP     Component Value Date/Time   NA 138 08/26/2018 1614   K 4.1 08/26/2018 1614   CL 103 08/26/2018 1614   CO2 27 08/26/2018 1614   GLUCOSE 107 (H) 08/26/2018 1614   BUN 19 08/26/2018 1614   CREATININE 1.10 08/26/2018 1614   CALCIUM 10.0 08/26/2018 1614   PROT 7.1 08/26/2018 1614   ALBUMIN 4.5 08/26/2018 1614   AST 19 08/26/2018 1614   ALT 23 08/26/2018 1614   ALKPHOS 70 08/26/2018 1614   BILITOT 0.7 08/26/2018 1614     Assessment/Plan:   Change in bowel habits History of colon polyps Rectal bleeding Rectal pain Hemorrhoids Fecal smearing Patient seen today for evaluation of rectal bleeding and hemorrhoids.  In the last year has had new onset rectal bleeding which he believes is due to irritation from hemorrhoids.  Has noticed intermittent protrusion of hemorrhoids, has also been having fecal smearing and difficulty getting clean after a bowel movement, with frequent cleaning causing further irritation of hemorrhoids.  Using OTC Preparation H wipes as needed.  Has noticed a significant amount of blood in the toilet on occasion. On exam has external versus prolapsed internal hemorrhoids and minimal skin breakdown. On last colonoscopy 10/2021 was found to have 9 colon polyps and 3-year recall recommended.  As he has now had a change in his normal bowel function with rectal bleeding, we will go ahead and schedule repeat colonoscopy for further evaluation.  - Schedule colonoscopy. I thoroughly discussed the procedure with the patient to include nature of the  procedure, alternatives, benefits, and risks (including but not limited to bleeding, infection, perforation, anesthesia/cardiac/pulmonary complications). Patient verbalized understanding and gave verbal consent to proceed with procedure.  - Start Benefiber 1 tablespoon daily to help bulk stools - Will send Anusol suppositories for treatment of hemorrhoids - Advised use of Desitin cream for mild perianal skin breakdown/irritation - Labs today: CBC, CMP  GERD Esophageal dysphagia Patient with history of GERD and dysphagia.  Last EGD 10/2021 with no esophageal lesions seen.  Did have a barium swallow 06/2023 which  showed mild esophageal dysmotility.  States that when he swallows he feels food gets stuck in his lower chest.  Can sometimes feel full the next morning after eating dinner.  Taking omeprazole  twice a day but continues to have symptoms.  Will plan for repeat EGD with possible dilation to be done with upcoming colonoscopy.  - Schedule EGD with possible dilation.  - Continue omeprazole  40 mg twice daily - If nothing to explain symptoms could consider gastric emptying study given his reported sensation of feeling full the next morning after eating dinner the night before   Camie Furbish, PA-C [facility] 06/07/2024, 10:11 AM  Patient Care Team: Donnise Norleen Lenis, MD as PCP - General (Family Medicine) Donnise, Norleen Lenis, MD as Referring Physician (Family Medicine)

## 2024-06-07 NOTE — Progress Notes (Signed)
 Attending Physician's Attestation   I have reviewed the chart.   I agree with the Advanced Practitioner's note, impression, and recommendations with any updates as below.    Corliss Parish, MD [facility] Advanced Endoscopy Office # [phone]

## 2024-06-07 NOTE — Patient Instructions (Addendum)
 Your provider has requested that you go to the basement level for lab work before leaving today. Press B on the elevator. The lab is located at the first door on the left as you exit the elevator.  We have sent the following medications to your pharmacy for you to pick up at your convenience: Anusol suppositories to use one at night for 5 nights.   Please purchase the following medications over the counter and take as directed: Benefiber 1 tablespoon daily. Also, purchase Desitin cream to apply to perianal area after bowel movements.   You have been scheduled for an endoscopy and colonoscopy. Please follow the written instructions given to you at your visit today.  If you use inhalers (even only as needed), please bring them with you on the day of your procedure.  DO NOT TAKE 7 DAYS PRIOR TO TEST- Trulicity (dulaglutide) Ozempic, Wegovy (semaglutide) Mounjaro (tirzepatide) Bydureon Bcise (exanatide extended release)  DO NOT TAKE 1 DAY PRIOR TO YOUR TEST Rybelsus (semaglutide) Adlyxin (lixisenatide) Victoza (liraglutide) Byetta (exanatide) ___________________________________________________________________________ _______________________________________________________  If your blood pressure at your visit was 140/90 or greater, please contact your primary care physician to follow up on this.  _______________________________________________________  If you are age 77 or older, your body mass index should be between 23-30. Your Body mass index is 38.85 kg/m. If this is out of the aforementioned range listed, please consider follow up with your Primary Care Provider.  If you are age 51 or younger, your body mass index should be between 19-25. Your Body mass index is 38.85 kg/m. If this is out of the aformentioned range listed, please consider follow up with your Primary Care Provider.   ________________________________________________________  The [facility] providers would like to  encourage you to use MYCHART to communicate with providers for non-urgent requests or questions.  Due to long hold times on the telephone, sending your provider a message by [facility] may be a faster and more efficient way to get a response.  Please allow 48 business hours for a response.  Please remember that this is for non-urgent requests.  _______________________________________________________  [facility] is using a team-based approach to care.  Your team is made up of your doctor and two to three APPS. Our APPS (Nurse Practitioners and Physician Assistants) work with your physician to ensure care continuity for you. They are fully qualified to address your health concerns and develop a treatment plan. They communicate directly with your gastroenterologist to care for you. Seeing the Advanced Practice Practitioners on your physician's team can help you by facilitating care more promptly, often allowing for earlier appointments, access to diagnostic testing, procedures, and other specialty referrals.

## 2024-06-08 ENCOUNTER — Ambulatory Visit (HOSPITAL_BASED_OUTPATIENT_CLINIC_OR_DEPARTMENT_OTHER)
Admission: RE | Admit: 2024-06-08 | Discharge: 2024-06-08 | Disposition: A | Discharge status: Home / Self Care | Attending: Orthopaedic Surgery | Admitting: Orthopaedic Surgery

## 2024-06-08 ENCOUNTER — Encounter (HOSPITAL_BASED_OUTPATIENT_CLINIC_OR_DEPARTMENT_OTHER)
Admission: RE | Disposition: A | Discharge status: Home / Self Care | Payer: Self-pay | Source: Home / Self Care | Attending: Orthopaedic Surgery

## 2024-06-08 ENCOUNTER — Encounter (HOSPITAL_BASED_OUTPATIENT_CLINIC_OR_DEPARTMENT_OTHER): Payer: Self-pay | Admitting: Orthopaedic Surgery

## 2024-06-08 ENCOUNTER — Other Ambulatory Visit: Payer: Self-pay

## 2024-06-08 ENCOUNTER — Ambulatory Visit (HOSPITAL_BASED_OUTPATIENT_CLINIC_OR_DEPARTMENT_OTHER): Admitting: Anesthesiology

## 2024-06-08 DIAGNOSIS — M1990 Unspecified osteoarthritis, unspecified site: Secondary | ICD-10-CM | POA: Insufficient documentation

## 2024-06-08 DIAGNOSIS — L600 Ingrowing nail: Secondary | ICD-10-CM | POA: Insufficient documentation

## 2024-06-08 DIAGNOSIS — I10 Essential (primary) hypertension: Secondary | ICD-10-CM | POA: Diagnosis not present

## 2024-06-08 DIAGNOSIS — N289 Disorder of kidney and ureter, unspecified: Secondary | ICD-10-CM | POA: Insufficient documentation

## 2024-06-08 DIAGNOSIS — Z87891 Personal history of nicotine dependence: Secondary | ICD-10-CM | POA: Diagnosis not present

## 2024-06-08 DIAGNOSIS — G4733 Obstructive sleep apnea (adult) (pediatric): Secondary | ICD-10-CM | POA: Insufficient documentation

## 2024-06-08 DIAGNOSIS — K219 Gastro-esophageal reflux disease without esophagitis: Secondary | ICD-10-CM | POA: Insufficient documentation

## 2024-06-08 DIAGNOSIS — B351 Tinea unguium: Secondary | ICD-10-CM | POA: Diagnosis present

## 2024-06-08 HISTORY — DX: Essential (primary) hypertension: I10

## 2024-06-08 HISTORY — DX: Obstructive sleep apnea (adult) (pediatric): G47.33

## 2024-06-08 SURGERY — EXCISION OF NAIL MATRIX BED
Anesthesia: LOCAL | Laterality: Left

## 2024-06-08 SURGERY — EXCISION OF NAIL MATRIX BED
Anesthesia: Monitor Anesthesia Care | Site: First Toe | Laterality: Left

## 2024-06-08 MED ORDER — CHLORHEXIDINE GLUCONATE 4 % EX SOLN: 60.0000 mL | Freq: Once | CUTANEOUS | Status: DC

## 2024-06-08 MED ORDER — LIDOCAINE 2% (20 MG/ML) 5 ML SYRINGE
INTRAMUSCULAR | Status: AC
  Filled 2024-06-08: qty 10

## 2024-06-08 MED ORDER — FENTANYL CITRATE (PF) 100 MCG/2ML IJ SOLN
INTRAMUSCULAR | Status: AC
  Filled 2024-06-08: qty 2

## 2024-06-08 MED ORDER — FENTANYL CITRATE (PF) 100 MCG/2ML IJ SOLN: 25.0000 ug | INTRAMUSCULAR | Status: DC | PRN

## 2024-06-08 MED ORDER — DOCUSATE SODIUM 100 MG PO CAPS: 100.0000 mg | ORAL_CAPSULE | Freq: Two times a day (BID) | ORAL | 0 refills | Status: AC

## 2024-06-08 MED ORDER — PROPOFOL 500 MG/50ML IV EMUL
INTRAVENOUS | Status: DC | PRN
  Administered 2024-06-08: 100 ug/kg/min via INTRAVENOUS

## 2024-06-08 MED ORDER — DEXMEDETOMIDINE HCL IN NACL 80 MCG/20ML IV SOLN
INTRAVENOUS | Status: AC
  Filled 2024-06-08: qty 20

## 2024-06-08 MED ORDER — OXYCODONE HCL 5 MG PO TABS: 5.0000 mg | ORAL_TABLET | Freq: Four times a day (QID) | ORAL | 0 refills | Status: AC | PRN

## 2024-06-08 MED ORDER — CEFAZOLIN SODIUM-DEXTROSE 2-4 GM/100ML-% IV SOLN
INTRAVENOUS | Status: AC
  Filled 2024-06-08: qty 100

## 2024-06-08 MED ORDER — DROPERIDOL 2.5 MG/ML IJ SOLN: 0.6250 mg | Freq: Once | INTRAMUSCULAR | Status: DC | PRN

## 2024-06-08 MED ORDER — ACETAMINOPHEN 500 MG PO TABS
1000.0000 mg | ORAL_TABLET | Freq: Once | ORAL | Status: AC
  Administered 2024-06-08: 1000 mg via ORAL

## 2024-06-08 MED ORDER — LACTATED RINGERS IV SOLN: INTRAVENOUS | Status: DC | PRN

## 2024-06-08 MED ORDER — ONDANSETRON HCL 4 MG/2ML IJ SOLN
INTRAMUSCULAR | Status: AC
Start: 2024-06-08 — End: 2024-06-08
  Filled 2024-06-08: qty 8

## 2024-06-08 MED ORDER — CEFAZOLIN SODIUM-DEXTROSE 2-4 GM/100ML-% IV SOLN
2.0000 g | INTRAVENOUS | Status: AC
  Administered 2024-06-08: 2 g via INTRAVENOUS

## 2024-06-08 MED ORDER — ONDANSETRON HCL 4 MG/2ML IJ SOLN
INTRAMUSCULAR | Status: DC | PRN
Start: 2024-06-08 — End: 2024-06-08
  Administered 2024-06-08: 4 mg via INTRAVENOUS

## 2024-06-08 MED ORDER — PROPOFOL 500 MG/50ML IV EMUL
INTRAVENOUS | Status: AC
Start: 2024-06-08 — End: 2024-06-08
  Filled 2024-06-08: qty 100

## 2024-06-08 MED ORDER — 0.9 % SODIUM CHLORIDE (POUR BTL) OPTIME
TOPICAL | Status: DC | PRN
  Administered 2024-06-08: 1000 mL

## 2024-06-08 MED ORDER — FENTANYL CITRATE (PF) 100 MCG/2ML IJ SOLN
INTRAMUSCULAR | Status: DC | PRN
  Administered 2024-06-08 (×4): 25 ug via INTRAVENOUS

## 2024-06-08 MED ORDER — ACETAMINOPHEN 500 MG PO TABS
ORAL_TABLET | ORAL | Status: AC
  Filled 2024-06-08: qty 2

## 2024-06-08 MED ORDER — PHENYLEPHRINE 80 MCG/ML (10ML) SYRINGE FOR IV PUSH (FOR BLOOD PRESSURE SUPPORT)
PREFILLED_SYRINGE | INTRAVENOUS | Status: AC
  Filled 2024-06-08: qty 30

## 2024-06-08 MED ORDER — ONDANSETRON 4 MG PO TBDP: 4.0000 mg | ORAL_TABLET | Freq: Three times a day (TID) | ORAL | 0 refills | Status: AC | PRN

## 2024-06-08 MED ORDER — PROPOFOL 1000 MG/100ML IV EMUL
INTRAVENOUS | Status: AC
Start: 2024-06-08 — End: 2024-06-08
  Filled 2024-06-08: qty 200

## 2024-06-08 MED ORDER — BUPIVACAINE HCL (PF) 0.5 % IJ SOLN
INTRAMUSCULAR | Status: AC
  Filled 2024-06-08: qty 30

## 2024-06-08 MED ORDER — BUPIVACAINE HCL (PF) 0.5 % IJ SOLN
INTRAMUSCULAR | Status: DC | PRN
  Administered 2024-06-08: 10 mL

## 2024-06-08 MED ORDER — EPHEDRINE 5 MG/ML INJ
INTRAVENOUS | Status: AC
Start: 2024-06-08 — End: 2024-06-08
  Filled 2024-06-08: qty 5

## 2024-06-08 MED ORDER — LIDOCAINE HCL (CARDIAC) PF 100 MG/5ML IV SOSY
PREFILLED_SYRINGE | INTRAVENOUS | Status: DC | PRN
  Administered 2024-06-08: 60 mg via INTRAVENOUS

## 2024-06-08 MED ORDER — PHENYLEPHRINE HCL-NACL 20-0.9 MG/250ML-% IV SOLN
INTRAVENOUS | Status: AC
Start: 2024-06-08 — End: 2024-06-08
  Filled 2024-06-08: qty 250

## 2024-06-08 SURGICAL SUPPLY — 41 items
BLADE SURG 15 STRL LF DISP TIS (BLADE) ×2 IMPLANT
BNDG COHESIVE 4X5 TAN STRL LF (GAUZE/BANDAGES/DRESSINGS) IMPLANT
BNDG COMPR ESMARK 6X3 LF (GAUZE/BANDAGES/DRESSINGS) IMPLANT
BNDG ELASTIC 4INX 5YD STR LF (GAUZE/BANDAGES/DRESSINGS) ×1 IMPLANT
BNDG STRETCH GAUZE 3IN X12FT (GAUZE/BANDAGES/DRESSINGS) ×1 IMPLANT
CANISTER SUCT 1200ML W/VALVE (MISCELLANEOUS) IMPLANT
CHLORAPREP W/TINT 26 (MISCELLANEOUS) ×1 IMPLANT
COVER BACK TABLE 60X90IN (DRAPES) ×1 IMPLANT
CUFF TRNQT CYL 34X4.125X (TOURNIQUET CUFF) ×1 IMPLANT
DRAPE EXTREMITY T 121X128X90 (DISPOSABLE) ×1 IMPLANT
DRAPE IMP U-DRAPE 54X76 (DRAPES) ×1 IMPLANT
DRAPE U-SHAPE 47X51 STRL (DRAPES) ×1 IMPLANT
DRSG MEPITEL 4X7.2 (GAUZE/BANDAGES/DRESSINGS) ×1 IMPLANT
ELECTRODE REM PT RTRN 9FT ADLT (ELECTROSURGICAL) ×1 IMPLANT
GAUZE PAD ABD 8X10 STRL (GAUZE/BANDAGES/DRESSINGS) IMPLANT
GAUZE SPONGE 4X4 12PLY STRL (GAUZE/BANDAGES/DRESSINGS) ×1 IMPLANT
GLOVE BIOGEL PI IND STRL 7.0 (GLOVE) IMPLANT
GLOVE BIOGEL PI IND STRL 7.5 (GLOVE) IMPLANT
GLOVE BIOGEL PI IND STRL 8 (GLOVE) ×1 IMPLANT
GLOVE SURG SS PI 7.0 STRL IVOR (GLOVE) IMPLANT
GLOVE SURG SS PI 7.5 STRL IVOR (GLOVE) ×1 IMPLANT
GOWN STRL REUS W/ TWL LRG LVL3 (GOWN DISPOSABLE) ×2 IMPLANT
GOWN STRL REUS W/ TWL XL LVL3 (GOWN DISPOSABLE) IMPLANT
NDL HYPO 22X1.5 SAFETY MO (MISCELLANEOUS) IMPLANT
NEEDLE HYPO 22X1.5 SAFETY MO (MISCELLANEOUS) IMPLANT
PACK BASIN DAY SURGERY FS (CUSTOM PROCEDURE TRAY) ×1 IMPLANT
PADDING CAST ABS COTTON 4X4 ST (CAST SUPPLIES) IMPLANT
PENCIL SMOKE EVACUATOR (MISCELLANEOUS) IMPLANT
SHEET MEDIUM DRAPE 40X70 STRL (DRAPES) ×1 IMPLANT
SLEEVE SCD COMPRESS KNEE MED (STOCKING) ×1 IMPLANT
SOLN 0.9% NACL POUR BTL 1000ML (IV SOLUTION) ×1 IMPLANT
SPIKE FLUID TRANSFER (MISCELLANEOUS) IMPLANT
SPONGE T-LAP 18X18 ~~LOC~~+RFID (SPONGE) ×1 IMPLANT
SUCTION TUBE FRAZIER 10FR DISP (SUCTIONS) IMPLANT
SUT ETHILON 2 0 FS 18 (SUTURE) ×1 IMPLANT
SUT VIC AB 0 CT1 27XBRD ANBCTR (SUTURE) IMPLANT
SUT VIC AB 2-0 CT1 TAPERPNT 27 (SUTURE) IMPLANT
SYR BULB EAR ULCER 3OZ GRN STR (SYRINGE) ×1 IMPLANT
TOWEL GREEN STERILE FF (TOWEL DISPOSABLE) ×2 IMPLANT
TUBE CONNECTING 20X1/4 (TUBING) IMPLANT
UNDERPAD 30X36 HEAVY ABSORB (UNDERPADS AND DIAPERS) ×1 IMPLANT

## 2024-06-08 NOTE — Anesthesia Preprocedure Evaluation (Addendum)
 Anesthesia Evaluation  Patient identified by MRN, date of birth, ID band Patient awake    Reviewed: Allergy & Precautions, NPO status , Patient's Chart, lab work & pertinent test results  Airway Mallampati: III  TM Distance: >3 FB Neck ROM: Full    Dental  (+) Dental Advisory Given, Upper Dentures   Pulmonary sleep apnea , former smoker   Pulmonary exam normal breath sounds clear to auscultation- rhonchi (-) wheezing      Cardiovascular hypertension,  Rhythm:Regular Rate:Normal     Neuro/Psych negative neurological ROS     GI/Hepatic Neg liver ROS,GERD  Medicated,,  Endo/Other  negative endocrine ROS    Renal/GU Renal disease     Musculoskeletal  (+) Arthritis ,    Abdominal  (+) + obese  Peds  Hematology negative hematology ROS (+)   Anesthesia Other Findings   Reproductive/Obstetrics                              Anesthesia Physical Anesthesia Plan  ASA: 2  Anesthesia Plan: MAC   Post-op Pain Management: Minimal or no pain anticipated   Induction:   PONV Risk Score and Plan: 1 and Treatment may vary due to age or medical condition, Propofol infusion and TIVA  Airway Management Planned: Natural Airway  Additional Equipment:   Intra-op Plan:   Post-operative Plan:   Informed Consent: I have reviewed the patients History and Physical, chart, labs and discussed the procedure including the risks, benefits and alternatives for the proposed anesthesia with the patient or authorized representative who has indicated his/her understanding and acceptance.     Dental advisory given  Plan Discussed with: CRNA  Anesthesia Plan Comments: (Pt endorses slight dry cough. Denies fevers or chills. Risks of anesthesia explained at length. This includes, but is not limited to, sore throat, damage to teeth, lips gums, tongue and vocal cords, nausea and vomiting, reactions to medications,  stroke, heart attack, and death. All patient questions were answered and the patient wishes to proceed. )         Anesthesia Quick Evaluation

## 2024-06-08 NOTE — H&P (Signed)
 H&P Update:  -History and Physical Reviewed  -Patient has been re-examined  -No change in the plan of care  -The risks and benefits were presented and reviewed. The risks due to permanent loss of nail, nail deformity, nail recurrence, chronic pain, suture failure and/or irritation, new/persistent infection, stiffness, nerve/vessel/tendon injury or rerupture of repaired tendon, nonunion/malunion, allograft usage, wound healing issues, development of arthritis, failure of this surgery, possibility of external fixation with delayed definitive surgery, need for further surgery, thromboembolic events, anesthesia/medical complications, amputation, death among others were discussed. The patient acknowledged the explanation, agreed to proceed with the plan and a consent was signed.  Lillia Mountain

## 2024-06-08 NOTE — Anesthesia Postprocedure Evaluation (Signed)
 Anesthesia Post Note  Patient: Kyle Nixon  Procedure(s) Performed: Left great toe nail excision with ablation of nail bed (Left: First Toe)     Patient location during evaluation: PACU Anesthesia Type: MAC Level of consciousness: awake and alert Pain management: pain level controlled Vital Signs Assessment: post-procedure vital signs reviewed and stable Respiratory status: spontaneous breathing, nonlabored ventilation and respiratory function stable Cardiovascular status: stable and blood pressure returned to baseline Postop Assessment: no apparent nausea or vomiting Anesthetic complications: no   No notable events documented.  Last Vitals:  Vitals:   06/08/24 1145 06/08/24 1200  BP: 135/84 138/79  Pulse: (!) 59 60  Resp: 19 20  Temp:  (!) 36.2 C  SpO2: 93% 93%    Last Pain:  Vitals:   06/08/24 1200  TempSrc:   PainSc: 0-No pain                 Sandrika Schwinn,W. EDMOND

## 2024-06-08 NOTE — Op Note (Signed)
 06/08/2024  11:36 AM   PATIENT: Kyle Nixon  78 y.o. male  MRN: [id number]   PRE-OPERATIVE DIAGNOSIS:   Severe onychomycosis of ingrown left great toenail   POST-OPERATIVE DIAGNOSIS:   Same   PROCEDURE: Left great toe nail excision with electrocautery ablation of nail bed   SURGEON:  Lillia Mountain, MD   ASSISTANT: None   ANESTHESIA: General, regional   EBL: Minimal   TOURNIQUET:   None used   COMPLICATIONS: None apparent   DISPOSITION: Extubated, awake and stable to recovery.   INDICATION FOR PROCEDURE: The patient presented with above diagnosis.  We discussed the diagnosis, alternative treatment options, risks and benefits of the above surgical intervention, as well as alternative non-operative treatments. All questions/concerns were addressed and the patient/family demonstrated appropriate understanding of the diagnosis, the procedure, the postoperative course, and overall prognosis. The patient wished to proceed with surgical intervention and signed an informed surgical consent as such, in each others presence prior to surgery.   PROCEDURE IN DETAIL: After preoperative consent was obtained and the correct operative site was identified, the patient was brought to the operating room supine on stretcher. General anesthesia was induced. Preoperative antibiotics were administered. Surgical timeout was taken. The patient was then positioned supine with an ipsilateral hip bump. The operative lower extremity was prepped and draped in standard sterile fashion.  We performed local anesthesia thru field block of the left great toe nail. A freer elevator was used to separate the thickened nail from the bed. It was removed completely in one piece. Electrocautery ablation of the nail matrix was performed.  The surgical sites were thoroughly irrigated. Hemostasis achieved.    The leg was cleaned with saline and sterile dressings with gauze were applied. A well  padded loose wrap was applied. The patient was awakened from anesthesia and transported to the recovery room in stable condition.    FOLLOW UP PLAN: -transfer to PACU, then home -WBAT operative extremity, maximum elevation -maintain daily dry dressings until follow up -follow up as outpatient within 7-10 days for wound check   RADIOGRAPHS: None used   Lillia Mountain [facility]

## 2024-06-08 NOTE — Transfer of Care (Signed)
 Immediate Anesthesia Transfer of Care Note  Patient: Kyle Nixon  Procedure(s) Performed: Left great toe nail excision with ablation of nail bed (Left: First Toe)  Patient Location: PACU  Anesthesia Type:MAC  Level of Consciousness: awake, alert , and oriented  Airway & Oxygen Therapy: Patient Spontanous Breathing and Patient connected to face mask oxygen  Post-op Assessment: Report given to RN and Post -op Vital signs reviewed and stable  Post vital signs: Reviewed and stable  Last Vitals:  Vitals Value Taken Time  BP 125/85 06/08/24 11:30  Temp 36.4 C 06/08/24 11:30  Pulse 66 06/08/24 11:33  Resp 20 06/08/24 11:33  SpO2 96 % 06/08/24 11:33  Vitals shown include unfiled device data.  Last Pain:  Vitals:   06/08/24 1130  TempSrc:   PainSc: Asleep      Patients Stated Pain Goal: 3 (06/08/24 0825)  Complications: No notable events documented.

## 2024-06-08 NOTE — Anesthesia Procedure Notes (Signed)
 Procedure Name: MAC Date/Time: 06/08/2024 11:08 AM  Performed by: Pam Macario BROCKS, CRNAPre-anesthesia Checklist: Patient being monitored, Timeout performed, Suction available, Emergency Drugs available and Patient identified Patient Re-evaluated:Patient Re-evaluated prior to induction Oxygen Delivery Method: Simple face mask Preoxygenation: Pre-oxygenation with 100% oxygen Induction Type: IV induction Placement Confirmation: breath sounds checked- equal and bilateral, CO2 detector and positive ETCO2

## 2024-07-14 ENCOUNTER — Other Ambulatory Visit: Payer: Self-pay | Admitting: Physician Assistant

## 2024-07-27 ENCOUNTER — Encounter: Admitting: Gastroenterology
